# Patient Record
Sex: Female | Born: 1970 | Race: White | Hispanic: No | Marital: Married | Smoking: Former smoker
Health system: Southern US, Community
[De-identification: ages and names within clinical notes are randomized; demographics above are authoritative.]

## PROBLEM LIST (undated history)

## (undated) DIAGNOSIS — F329 Major depressive disorder, single episode, unspecified: Secondary | ICD-10-CM

## (undated) DIAGNOSIS — R32 Unspecified urinary incontinence: Secondary | ICD-10-CM

## (undated) DIAGNOSIS — F419 Anxiety disorder, unspecified: Secondary | ICD-10-CM

## (undated) DIAGNOSIS — D649 Anemia, unspecified: Secondary | ICD-10-CM

## (undated) DIAGNOSIS — E785 Hyperlipidemia, unspecified: Secondary | ICD-10-CM

## (undated) DIAGNOSIS — F32A Depression, unspecified: Secondary | ICD-10-CM

## (undated) DIAGNOSIS — K219 Gastro-esophageal reflux disease without esophagitis: Secondary | ICD-10-CM

## (undated) HISTORY — DX: Anemia, unspecified: D64.9

## (undated) HISTORY — DX: Gastro-esophageal reflux disease without esophagitis: K21.9

## (undated) HISTORY — DX: Anxiety disorder, unspecified: F41.9

## (undated) HISTORY — DX: Depression, unspecified: F32.A

## (undated) HISTORY — DX: Hyperlipidemia, unspecified: E78.5

## (undated) HISTORY — DX: Unspecified urinary incontinence: R32

---
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
## 2000-10-10 ENCOUNTER — Encounter: Payer: Self-pay | Admitting: Emergency Medicine

## 2000-10-10 ENCOUNTER — Emergency Department (HOSPITAL_COMMUNITY): Admission: EM | Admit: 2000-10-10 | Discharge: 2000-10-10 | Payer: Self-pay | Admitting: Emergency Medicine

## 2006-10-21 ENCOUNTER — Inpatient Hospital Stay (HOSPITAL_COMMUNITY): Admission: AD | Admit: 2006-10-21 | Discharge: 2006-10-21 | Payer: Self-pay | Admitting: Obstetrics and Gynecology

## 2007-06-12 ENCOUNTER — Inpatient Hospital Stay (HOSPITAL_COMMUNITY): Admission: AD | Admit: 2007-06-12 | Discharge: 2007-06-13 | Payer: Self-pay | Admitting: Obstetrics & Gynecology

## 2010-11-21 LAB — CBC
HCT: 31.8 — ABNORMAL LOW
HCT: 37.3
Hemoglobin: 11.1 — ABNORMAL LOW
Hemoglobin: 12.9
MCHC: 34.5
MCHC: 34.9
MCV: 85.7
MCV: 85.9
Platelets: 179
Platelets: 210
RBC: 3.7 — ABNORMAL LOW
RBC: 4.36
RDW: 13.8
RDW: 13.9
WBC: 14.5 — ABNORMAL HIGH
WBC: 14.8 — ABNORMAL HIGH

## 2010-11-21 LAB — RPR: RPR Ser Ql: NONREACTIVE

## 2010-12-08 LAB — POCT PREGNANCY, URINE: Preg Test, Ur: POSITIVE

## 2010-12-08 LAB — URINALYSIS, ROUTINE W REFLEX MICROSCOPIC
Hgb urine dipstick: NEGATIVE
Ketones, ur: NEGATIVE
Protein, ur: NEGATIVE
Urobilinogen, UA: 0.2

## 2013-02-26 DIAGNOSIS — D649 Anemia, unspecified: Secondary | ICD-10-CM

## 2013-02-26 HISTORY — DX: Anemia, unspecified: D64.9

## 2014-09-22 ENCOUNTER — Encounter (HOSPITAL_COMMUNITY): Payer: Self-pay | Admitting: Emergency Medicine

## 2014-09-22 DIAGNOSIS — Z87891 Personal history of nicotine dependence: Secondary | ICD-10-CM | POA: Insufficient documentation

## 2014-09-22 DIAGNOSIS — R079 Chest pain, unspecified: Secondary | ICD-10-CM | POA: Diagnosis present

## 2014-09-22 DIAGNOSIS — R0789 Other chest pain: Secondary | ICD-10-CM | POA: Diagnosis not present

## 2014-09-22 DIAGNOSIS — D509 Iron deficiency anemia, unspecified: Secondary | ICD-10-CM | POA: Insufficient documentation

## 2014-09-22 NOTE — ED Notes (Addendum)
Pt. reports left chest pain with SOB and diaphoresis onset 10 pm , denies nausea or cough . Pt. took 1 regular ASA prior to arrival.

## 2014-09-23 ENCOUNTER — Emergency Department (HOSPITAL_COMMUNITY)
Admission: EM | Admit: 2014-09-23 | Discharge: 2014-09-23 | Disposition: A | Discharge status: Home / Self Care | Payer: 59 | Attending: Emergency Medicine | Admitting: Emergency Medicine

## 2014-09-23 ENCOUNTER — Emergency Department (HOSPITAL_COMMUNITY): Payer: 59

## 2014-09-23 DIAGNOSIS — D509 Iron deficiency anemia, unspecified: Secondary | ICD-10-CM

## 2014-09-23 DIAGNOSIS — R0789 Other chest pain: Secondary | ICD-10-CM

## 2014-09-23 LAB — CBC
HEMATOCRIT: 33.3 % — AB (ref 36.0–46.0)
Hemoglobin: 10.8 g/dL — ABNORMAL LOW (ref 12.0–15.0)
MCH: 25.3 pg — AB (ref 26.0–34.0)
MCHC: 32.4 g/dL (ref 30.0–36.0)
MCV: 78 fL (ref 78.0–100.0)
Platelets: 275 10*3/uL (ref 150–400)
RBC: 4.27 MIL/uL (ref 3.87–5.11)
RDW: 15.3 % (ref 11.5–15.5)
WBC: 7 10*3/uL (ref 4.0–10.5)

## 2014-09-23 LAB — BASIC METABOLIC PANEL
ANION GAP: 7 (ref 5–15)
BUN: 8 mg/dL (ref 6–20)
CO2: 25 mmol/L (ref 22–32)
Calcium: 9.7 mg/dL (ref 8.9–10.3)
Chloride: 106 mmol/L (ref 101–111)
Creatinine, Ser: 0.82 mg/dL (ref 0.44–1.00)
GFR calc Af Amer: >60 mL/min (ref >60)
GFR calc non Af Amer: >60 mL/min (ref >60)
GLUCOSE: 119 mg/dL — AB (ref 65–99)
Potassium: 3.8 mmol/L (ref 3.5–5.1)
Sodium: 138 mmol/L (ref 135–145)

## 2014-09-23 LAB — I-STAT TROPONIN, ED
Troponin i, poc: 0 ng/mL (ref 0.00–0.08)
Troponin i, poc: 0 ng/mL (ref 0.00–0.08)

## 2014-09-23 MED ORDER — IBUPROFEN 800 MG PO TABS
800.0000 mg | ORAL_TABLET | Freq: Once | ORAL | Status: AC
  Administered 2014-09-23: 800 mg via ORAL
  Filled 2014-09-23: qty 1

## 2014-09-23 MED ORDER — IBUPROFEN 800 MG PO TABS: 800.0000 mg | ORAL_TABLET | Freq: Three times a day (TID) | ORAL | Status: DC

## 2014-09-23 NOTE — ED Notes (Signed)
The pt   Started having generalized chest pain around 2200 tonight.  She had a weird feeling in her chest earlier.  Her chest discomfort has almost totally gone.  She had some dizziness.  She was anxious 2 weeks ago.  No long car trips no leg pain.  She appears to be very anxious.lmp July 13th

## 2014-09-23 NOTE — ED Provider Notes (Signed)
CSN: [id number]     Arrival date & time 09/22/14  2353 History  This chart was scribed for Dione Booze, MD by Doreatha Martin, ED Scribe. This patient was seen in room A01C/A01C and the patient's care was started at 2:58 AM.     Chief Complaint  Patient presents with  . Chest Pain   The history is provided by the patient. No language interpreter was used.    HPI Comments: Jean Vazquez is a 44 y.o. female who presents to the Emergency Department complaining of moderate, tight, intermittent generalized 6/10 CP onset 2200 last night while at rest. She states associated dizziness described as feeling like she would pass out, diaphoresis and shallow breathing. Pt states her initial episode lasted 30 minutes and has had 5 episodes since lasting only a few minutes (last episode at 2400). Pt states that she stretched and rested with no relief. Pain relieved by ambulation and worsened with inspiration. She notes CP a few weeks ago but not this severe. Pt is a non-drinker and a former smoker (quit 4 years ago). No FHx of CHF or CAD<55yo. Otherwise healthy. She denies nausea.  History reviewed. No pertinent past medical history. History reviewed. No pertinent past surgical history. No family history on file. History  Substance Use Topics  . Smoking status: Former Games developer  . Smokeless tobacco: Not on file  . Alcohol Use: No   OB History    No data available     Review of Systems  Constitutional: Positive for diaphoresis.  Respiratory:       Shallow breathing  Cardiovascular: Positive for chest pain.  Gastrointestinal: Negative for nausea.  Neurological: Positive for dizziness.  All other systems reviewed and are negative.   Allergies  Review of patient's allergies indicates no known allergies.  Home Medications   Prior to Admission medications   Not on File   BP 126/72 mmHg  Pulse 59  Temp(Src) 97.7 F (36.5 C) (Oral)  Resp 14  Wt 158 lb 3.2 oz (71.759 kg)  SpO2 99%  LMP  09/08/2014 Physical Exam  Constitutional: She is oriented to person, place, and time. She appears well-developed and well-nourished.  HENT:  Head: Normocephalic and atraumatic.  Eyes: Conjunctivae and EOM are normal. Pupils are equal, round, and reactive to light.  Neck: Normal range of motion. Neck supple.  Cardiovascular: Normal rate.   Pulmonary/Chest: Effort normal. No respiratory distress.  Abdominal: She exhibits no distension.  Musculoskeletal: Normal range of motion. She exhibits tenderness.  Moderate tenderness left anterior ribcage  Neurological: She is alert and oriented to person, place, and time.  Skin: Skin is warm and dry.  Psychiatric: She has a normal mood and affect. Her behavior is normal.  Nursing note and vitals reviewed.   ED Course  Procedures (including critical care time) DIAGNOSTIC STUDIES: Oxygen Saturation is 99% on RA, normal by my interpretation.    COORDINATION OF CARE: 3:07 AM Discussed treatment plan with pt at bedside and pt agreed to plan.   Labs Review Results for orders placed or performed during the hospital encounter of 09/23/14  Basic metabolic panel  Result Value Ref Range   Sodium 138 135 - 145 mmol/L   Potassium 3.8 3.5 - 5.1 mmol/L   Chloride 106 101 - 111 mmol/L   CO2 25 22 - 32 mmol/L   Glucose, Bld 119 (H) 65 - 99 mg/dL   BUN 8 6 - 20 mg/dL   Creatinine, Ser 4.09 0.44 - 1.00 mg/dL  Calcium 9.7 8.9 - 10.3 mg/dL   GFR calc non Af Amer >60 >60 mL/min   GFR calc Af Amer >60 >60 mL/min   Anion gap 7 5 - 15  CBC  Result Value Ref Range   WBC 7.0 4.0 - 10.5 K/uL   RBC 4.27 3.87 - 5.11 MIL/uL   Hemoglobin 10.8 (L) 12.0 - 15.0 g/dL   HCT 40.9 (L) 81.1 - 91.4 %   MCV 78.0 78.0 - 100.0 fL   MCH 25.3 (L) 26.0 - 34.0 pg   MCHC 32.4 30.0 - 36.0 g/dL   RDW 78.2 95.6 - 21.3 %   Platelets 275 150 - 400 K/uL  I-stat troponin, ED  Result Value Ref Range   Troponin i, poc 0.00 0.00 - 0.08 ng/mL   Comment 3          I-stat troponin,  ED  Result Value Ref Range   Troponin i, poc 0.00 0.00 - 0.08 ng/mL   Comment 3           Imaging Review Dg Chest 2 View  09/23/2014   CLINICAL DATA:  Left-sided chest pain, onset tonight while sleeping.  EXAM: CHEST  2 VIEW  COMPARISON:  None.  FINDINGS: The cardiomediastinal contours are normal. The lungs are clear. Pulmonary vasculature is normal. No consolidation, pleural effusion, or pneumothorax. No acute osseous abnormalities are seen.  IMPRESSION: No acute pulmonary process.   Electronically Signed   By: Rubye Oaks M.D.   On: 09/23/2014 01:19     EKG Interpretation   Date/Time:  Thursday September 23 2014 00:01:05 EDT Ventricular Rate:  70 PR Interval:  130 QRS Duration: 78 QT Interval:  368 QTC Calculation: 397 R Axis:   46 Text Interpretation:  Normal sinus rhythm with sinus arrhythmia Normal ECG  No old tracing to compare Confirmed by [facility]  MD, Sherrita Riederer ([id number]) on  09/23/2014 2:45:05 AM      MDM   Final diagnoses:  Chest wall pain  Microcytic anemia    Chest pain which seems most consistent with chest wall pain. ECG is unremarkable and troponin is normal. She has been pain-free since arrival in the ED. She's given a dose of ibuprofen with good relief of pain. Repeat troponin is also normal. She is discharged with prescription for ibuprofen.  I personally performed the services described in this documentation, which was scribed in my presence. The recorded information has been reviewed and is accurate.     Dione Booze, MD 09/23/14 [phone]

## 2014-09-23 NOTE — ED Notes (Signed)
Med given 

## 2014-09-23 NOTE — Discharge Instructions (Signed)
Your blood sugar was a little high today - 119. This is sometimes called "pre-diabetes". Please check your blood sugar every 1-2 months to make sure you are not becoming diabetic.  Chest Wall Pain Chest wall pain is pain in or around the bones and muscles of your chest. It may take up to 6 weeks to get better. It may take longer if you must stay physically active in your work and activities.  CAUSES  Chest wall pain may happen on its own. However, it may be caused by:  A viral illness like the flu.  Injury.  Coughing.  Exercise.  Arthritis.  Fibromyalgia.  Shingles. HOME CARE INSTRUCTIONS   Avoid overtiring physical activity. Try not to strain or perform activities that cause pain. This includes any activities using your chest or your abdominal and side muscles, especially if heavy weights are used.  Put ice on the sore area.  Put ice in a plastic bag.  Place a towel between your skin and the bag.  Leave the ice on for 15-20 minutes per hour while awake for the first 2 days.  Only take over-the-counter or prescription medicines for pain, discomfort, or fever as directed by your caregiver. SEEK IMMEDIATE MEDICAL CARE IF:   Your pain increases, or you are very uncomfortable.  You have a fever.  Your chest pain becomes worse.  You have new, unexplained symptoms.  You have nausea or vomiting.  You feel sweaty or lightheaded.  You have a cough with phlegm (sputum), or you cough up blood. MAKE SURE YOU:   Understand these instructions.  Will watch your condition.  Will get help right away if you are not doing well or get worse. Document Released: 02/12/2005 Document Revised: 05/07/2011 Document Reviewed: 10/09/2010 [facility] Patient Information 2015 [place], [place]. This information is not intended to replace advice given to you by your health care provider. Make sure you discuss any questions you have with your health care provider.  Ibuprofen tablets and  capsules What is this medicine? IBUPROFEN (eye BYOO proe fen) is a non-steroidal anti-inflammatory drug (NSAID). It is used for dental pain, fever, headaches or migraines, osteoarthritis, rheumatoid arthritis, or painful monthly periods. It can also relieve minor aches and pains caused by a cold, flu, or sore throat. This medicine may be used for other purposes; ask your health care provider or pharmacist if you have questions. COMMON BRAND NAME(S): Advil, Advil Junior Strength, Advil Migraine, Genpril, Ibren, IBU, Midol, Midol Cramps and Body Aches, Motrin, Motrin IB, Motrin Junior Strength, Motrin Migraine Pain, Samson-8, Toxicology Saliva Collection What should I tell my health care provider before I take this medicine? They need to know if you have any of these conditions: -asthma -cigarette smoker -drink more than 3 alcohol containing drinks a day -heart disease or circulation problems such as heart failure or leg edema (fluid retention) -high blood pressure -kidney disease -liver disease -stomach bleeding or ulcers -an unusual or allergic reaction to ibuprofen, aspirin, other NSAIDS, other medicines, foods, dyes, or preservatives -pregnant or trying to get pregnant -breast-feeding How should I use this medicine? Take this medicine by mouth with a glass of water. Follow the directions on the prescription label. Take this medicine with food if your stomach gets upset. Try to not lie down for at least 10 minutes after you take the medicine. Take your medicine at regular intervals. Do not take your medicine more often than directed. A special MedGuide will be given to you by the pharmacist with each  prescription and refill. Be sure to read this information carefully each time. Talk to your pediatrician regarding the use of this medicine in children. Special care may be needed. Overdosage: If you think you have taken too much of this medicine contact a poison control center or emergency room  at once. NOTE: This medicine is only for you. Do not share this medicine with others. What if I miss a dose? If you miss a dose, take it as soon as you can. If it is almost time for your next dose, take only that dose. Do not take double or extra doses. What may interact with this medicine? Do not take this medicine with any of the following medications: -cidofovir -ketorolac -methotrexate -pemetrexed This medicine may also interact with the following medications: -alcohol -aspirin -diuretics -lithium -other drugs for inflammation like prednisone -warfarin This list may not describe all possible interactions. Give your health care provider a list of all the medicines, herbs, non-prescription drugs, or dietary supplements you use. Also tell them if you smoke, drink alcohol, or use illegal drugs. Some items may interact with your medicine. What should I watch for while using this medicine? Tell your doctor or healthcare professional if your symptoms do not start to get better or if they get worse. This medicine does not prevent heart attack or stroke. In fact, this medicine may increase the chance of a heart attack or stroke. The chance may increase with longer use of this medicine and in people who have heart disease. If you take aspirin to prevent heart attack or stroke, talk with your doctor or health care professional. Do not take other medicines that contain aspirin, ibuprofen, or naproxen with this medicine. Side effects such as stomach upset, nausea, or ulcers may be more likely to occur. Many medicines available without a prescription should not be taken with this medicine. This medicine can cause ulcers and bleeding in the stomach and intestines at any time during treatment. Ulcers and bleeding can happen without warning symptoms and can cause death. To reduce your risk, do not smoke cigarettes or drink alcohol while you are taking this medicine. You may get drowsy or dizzy. Do not  drive, use machinery, or do anything that needs mental alertness until you know how this medicine affects you. Do not stand or sit up quickly, especially if you are an older patient. This reduces the risk of dizzy or fainting spells. This medicine can cause you to bleed more easily. Try to avoid damage to your teeth and gums when you brush or floss your teeth. This medicine may be used to treat migraines. If you take migraine medicines for 10 or more days a month, your migraines may get worse. Keep a diary of headache days and medicine use. Contact your healthcare professional if your migraine attacks occur more frequently. What side effects may I notice from receiving this medicine? Side effects that you should report to your doctor or health care professional as soon as possible: -allergic reactions like skin rash, itching or hives, swelling of the face, lips, or tongue -severe stomach pain -signs and symptoms of bleeding such as bloody or black, tarry stools; red or dark-brown urine; spitting up blood or brown material that looks like coffee grounds; red spots on the skin; unusual bruising or bleeding from the eye, gums, or nose -signs and symptoms of a blood clot such as changes in vision; chest pain; severe, sudden headache; trouble speaking; sudden numbness or weakness of the face, arm,  or leg -unexplained weight gain or swelling -unusually weak or tired -yellowing of eyes or skin Side effects that usually do not require medical attention (report to your doctor or health care professional if they continue or are bothersome): -bruising -diarrhea -dizziness, drowsiness -headache -nausea, vomiting This list may not describe all possible side effects. Call your doctor for medical advice about side effects. You may report side effects to FDA at 1-800-FDA-1088. Where should I keep my medicine? Keep out of the reach of children. Store at room temperature between 15 and 30 degrees C (59 and 86  degrees F). Keep container tightly closed. Throw away any unused medicine after the expiration date. NOTE: This sheet is a summary. It may not cover all possible information. If you have questions about this medicine, talk to your doctor, pharmacist, or health care provider.  2015, Elsevier/Gold Standard. (2012-10-14 10:48:02)   Emergency Department Resource Guide 1) Find a Doctor and Pay Out of Pocket Although you won't have to find out who is covered by your insurance plan, it is a good idea to ask around and get recommendations. You will then need to call the office and see if the doctor you have chosen will accept you as a new patient and what types of options they offer for patients who are self-pay. Some doctors offer discounts or will set up payment plans for their patients who do not have insurance, but you will need to ask so you aren't surprised when you get to your appointment.  2) Contact Your Local Health Department Not all health departments have doctors that can see patients for sick visits, but many do, so it is worth a call to see if yours does. If you don't know where your local health department is, you can check in your phone book. The CDC also has a tool to help you locate your state's health department, and many state websites also have listings of all of their local health departments.  3) Find a Walk-in Clinic If your illness is not likely to be very severe or complicated, you may want to try a walk in clinic. These are popping up all over the country in pharmacies, drugstores, and shopping centers. They're usually staffed by nurse practitioners or physician assistants that have been trained to treat common illnesses and complaints. They're usually fairly quick and inexpensive. However, if you have serious medical issues or chronic medical problems, these are probably not your best option.  No Primary Care Doctor: - Call Health Connect at  [phone] - they can help you locate  a primary care doctor that  accepts your insurance, provides certain services, etc. - Physician Referral Service- [phone]  Chronic Pain Problems: Organization         Address  Phone   Notes  [facility]  [phone] Patients need to be referred by their primary care doctor.   Medication Assistance: Organization         Address  Phone   Notes  [facility] Medication [facility] [address] [place] [place], [place] [address] [phone] --Must be a resident of [facility] -- Must have NO insurance coverage whatsoever (no Medicaid/ Medicare, etc.) -- The pt. MUST have a primary care doctor that directs their care regularly and follows them in the community   [facility]  [phone]   [employer]  [phone]    Agencies that provide inexpensive medical care: Organization  Address  Phone   Notes  [place]  [phone]   [facility]    [phone]   [facility] [place], [place] [address] [phone]   [place] [address] [place] [place] [phone]   Planned Parenthood    [phone]   [facility]    [phone]   [place] and [address], [place] Phone:  [phone], Fax:  [phone] Hours of Operation:  9 am - 6 pm, M-F.  Also accepts Medicaid/Medicare and self-pay.  [facility] for [place], [address], [place] Phone: [phone], Fax: [phone]. Hours of Operation:  8:30 am - 5:30 pm, M-F.  Also accepts Medicaid and self-pay.  [facility] [place] [address] [place] Phone: [phone]   [place], [place], [place] [phone], Ext. [phone] Mondays & Thursdays: 7-9 AM.  First 15 patients are seen on a first come, first serve basis.    [place]  Providers:  Organization         Address  Phone   Notes  [facility] [address], [place] [phone] Also accepts self-pay patients.  [facility] [address], [place]  [phone]   [place], [address], [place] [phone]   [facility] [address], [place] [phone]   Lucianne Lei [address], [place]   [phone] Only accepts [place] Access [place] patients after they have their name applied to their card.   Self-Pay (no insurance) in [facility]:  Organization         Address  Phone   Notes  Sickle Cell Patients, [facility] [place] [phone]   [facility] [place] [phone]   [facility] [place]  [place], [place], [place] [phone]   [facility]/Dr. Osei-Bonsu  [address] [place] or [address], [place] [phone] Phone number for both [place] and [place] locations is the same.  [facility] [address], [place] [phone]   [facility] [address], [place] or [address] [phone]   [facility] [address] [place] [phone], phone; [phone], fax Sees patients 1st and 3rd Saturday of every month.  Must not qualify for public or private insurance (i.e. Medicaid, Medicare, [place] Health Choice, Veterans' Benefits)  Household income should be no more than 200% of the poverty level The clinic cannot treat you if you are pregnant or think you are pregnant  Sexually transmitted diseases are not treated at the clinic.    Dental Care: Organization         Address  Phone  Notes  [facility] Department of [facility] [place] [phone] Accepts children up to age 77 who are enrolled in [place] or [place]; pregnant women with a Medicaid card; and children who have applied for Medicaid or [place] Health Choice, but were declined, whose parents can pay a reduced fee at time  of service.  [facility] Department of [facility]  [address], [place] [phone] Accepts children up to age 8 who are enrolled in [place] or [place]; pregnant women with a Medicaid card; and children who have applied for Medicaid or  Health Choice, but were declined, whose parents can pay a reduced fee at time of service.  [place] Adult Dental Access PROGRAM  [place] [phone] Patients are seen by appointment only. Walk-ins are not accepted. [place] will see patients 36 years of age and older. Monday - Tuesday (8am-5pm) Most Wednesdays (8:30-5pm) $30 per visit, cash only  [facility] Adult Dental Access PROGRAM  [address], [facility] [phone] Patients are seen by appointment only. Walk-ins are not accepted. [place] will see patients 63 years of age and older. One Wednesday Evening (Monthly: Volunteer Based).  $30 per visit, cash only  [place]  [phone] for adults; Children under age 61, call Graduate Pediatric Dentistry at [phone]. Children aged 4-14, please call [phone] to request a pediatric application.  Dental services are provided in all areas of dental care including fillings, crowns and bridges, complete and partial dentures, implants, gum treatment, root canals, and extractions. Preventive care is also provided. Treatment is provided to both adults and children. Patients are selected via a lottery and there is often a waiting list.   [facility] [address] [place]  [phone] [email or web address]   [facility]  [address] [place], [place] [phone], Ext. [phone] Second and Fourth Thursday of each month, opens at 6:30 AM; Clinic ends at 9 AM.  Patients are seen on a first-come first-served basis, and a limited number are seen during each clinic.   [facility]  [address] Hillard Danker [place], [place] [phone]   Eligibility Requirements You must have lived in [place], [place], or [place] counties for at least the last three months.   You cannot be eligible for state or federal sponsored [employer], including [employer], [place], or [employer].   You generally cannot be eligible for healthcare insurance through your employer.    How to apply: Eligibility screenings are held every Tuesday and Wednesday afternoon from 1:00 pm until 4:00 pm. You do not need an appointment for the interview!  [facility] [address] [place], [place]   [place]  [place] Department  [place]  [phone]    Behavioral Health Resources in the Community: Intensive Outpatient Programs Organization         Address  Phone  Notes  [place]. [address], [place], [place] [phone]   [facility] [address] [place], [place]   ADS: [facility] [address] [place], [place]   [place] [address]  [place], [place] or [phone]   Substance Abuse Resources Organization         Address  Phone  Notes  Alcohol and Drug Services  [phone]   [place]  [phone]   [facility] [place]  [phone]   Chinita Pester  [phone]   Residential & Outpatient Substance Abuse Program  [phone]   Psychological Services Organization         Address  Phone  Notes  [facility]  [phone]    [facility]  [phone]   [facility] [address], [place] [phone] or [phone]    Mobile Crisis Teams Organization         Address  Phone  Notes  [facility], Mobile Crisis Care Unit  [phone]   [facility]  [address] [place], [place] [phone]   Doristine Locks [address] [place] [phone]    Self-Help/Support Groups Organization         Address  Phone             Notes  [facility]oc[facility] of [place] - variety of support groups  336- I7437963 Call for more information  Narcotics Anonymous (NA), [facility] [address], [employer] [place]  2 meetings at this location   Statistician         Address  Phone  Notes  [facility] [address],    [place]  [phone]   [facility]  [address] [place] [address], [place], [place] [phone]   [facility] [address] [place], [place] [phone] Admissions: 8am-3pm M-F  [facility] [address],    [place], [place] [phone]   [facility] [address] [place], [address] [place], [place] [phone]   [facility] [address]  [place], [place] [phone]   [facility] - Intensive Outpatient [address], [place], [place] [phone]   [facility] [address] [place].,  [place], [place] [phone] or [phone]   [facility] ([facility]) [address], [place], [place] [phone] Accepts Medicaid  [facility] [address],  [place] [phone] Substance Abuse/Addiction Treatment   [facility] Organization         Address  Phone  Notes  [facility]  9202520276   Angie Fava, PhD [address] [place], [place]   [phone] or [phone]    [facility] Behavioral   [address] [place], [place] [phone]   [facility] [address], [place], [place] [phone] Insurance/Medicaid/sponsorship through [facility] and Families [address]                                    [place], [place] [phone] Therapy/tele-psych/case  [facility] [address], [place] [phone]    Dr. Lolly Mustache  [phone]   [facility] [place]  [employer] [facility] Dept. 1) [address] [place] 2) [address] [place] 3)  [address], [place] [phone]  [phone]   [facility] Child Abuse Hotline 947-870-2711 or [phone] (After Hours)

## 2014-11-05 ENCOUNTER — Other Ambulatory Visit: Payer: Self-pay | Admitting: Family

## 2014-11-05 DIAGNOSIS — Z1231 Encounter for screening mammogram for malignant neoplasm of breast: Secondary | ICD-10-CM

## 2014-11-19 ENCOUNTER — Ambulatory Visit: Payer: 59

## 2014-12-07 DIAGNOSIS — F419 Anxiety disorder, unspecified: Secondary | ICD-10-CM | POA: Insufficient documentation

## 2014-12-07 DIAGNOSIS — F32A Depression, unspecified: Secondary | ICD-10-CM | POA: Insufficient documentation

## 2015-01-13 DIAGNOSIS — F4322 Adjustment disorder with anxiety: Secondary | ICD-10-CM | POA: Insufficient documentation

## 2015-02-01 DIAGNOSIS — K219 Gastro-esophageal reflux disease without esophagitis: Secondary | ICD-10-CM | POA: Insufficient documentation

## 2016-08-28 DIAGNOSIS — D500 Iron deficiency anemia secondary to blood loss (chronic): Secondary | ICD-10-CM | POA: Insufficient documentation

## 2017-08-01 LAB — BASIC METABOLIC PANEL
BUN: 14 (ref 4–21)
CO2: 23 — AB (ref 13–22)
Chloride: 102 (ref 99–108)
Creatinine: 0.8 (ref 0.5–1.1)
Glucose: 73
Potassium: 4.5 (ref 3.4–5.3)
Sodium: 137 (ref 137–147)

## 2017-08-01 LAB — COMPREHENSIVE METABOLIC PANEL
Albumin: 4.6 (ref 3.5–5.0)
Calcium: 9.9 (ref 8.7–10.7)
GFR calc Af Amer: 97
GFR calc non Af Amer: 83
Globulin: 2.6

## 2017-08-01 LAB — CBC AND DIFFERENTIAL
HCT: 36 (ref 36–46)
HCT: 36 (ref 36–46)
Hemoglobin: 11.2 — AB (ref 12.0–16.0)
Hemoglobin: 11.2 — AB (ref 12.0–16.0)
Neutrophils Absolute: 4451
Neutrophils Absolute: 4451
Platelets: 370 (ref 150–399)
Platelets: 370 (ref 150–399)
WBC: 7.7
WBC: 7.7

## 2017-08-01 LAB — LIPID PANEL
Cholesterol: 302 — AB (ref 0–200)
HDL: 60 (ref 35–70)
LDL Cholesterol: 193
LDl/HDL Ratio: 5
Triglycerides: 274 — AB (ref 40–160)

## 2017-08-01 LAB — HEPATIC FUNCTION PANEL
ALT: 21 (ref 7–35)
AST: 32 (ref 13–35)
Alkaline Phosphatase: 57 (ref 25–125)
Bilirubin, Total: 0.4

## 2017-08-01 LAB — TSH: TSH: 0.79 (ref 0.41–5.90)

## 2017-08-01 LAB — CBC
RBC: 4.79 (ref 3.87–5.11)
RBC: 4.79 (ref 3.87–5.11)

## 2017-08-05 LAB — HM MAMMOGRAPHY

## 2017-08-13 LAB — CBC AND DIFFERENTIAL
HCT: 37 (ref 36–46)
Hemoglobin: 11.4 — AB (ref 12.0–16.0)
Neutrophils Absolute: 3808
Platelets: 294 (ref 150–399)
WBC: 6.6

## 2017-08-13 LAB — TSH: TSH: 0.56 (ref 0.41–5.90)

## 2017-08-13 LAB — BASIC METABOLIC PANEL WITH GFR
BUN: 13 (ref 4–21)
CO2: 23 — AB (ref 13–22)
Chloride: 106 (ref 99–108)
Creatinine: 0.9 (ref 0.5–1.1)
Glucose: 90
Potassium: 4.7 (ref 3.4–5.3)
Sodium: 138 (ref 137–147)

## 2017-08-13 LAB — HEPATIC FUNCTION PANEL
ALT: 14 (ref 7–35)
AST: 17 (ref 13–35)
Alkaline Phosphatase: 61 (ref 25–125)
Bilirubin, Total: 0.2

## 2017-08-13 LAB — COMPREHENSIVE METABOLIC PANEL
Albumin: 3.9 (ref 3.5–5.0)
Calcium: 9.4 (ref 8.7–10.7)
GFR calc Af Amer: 95
GFR calc non Af Amer: 82
Globulin: 2.6

## 2017-08-13 LAB — LIPID PANEL
Cholesterol: 144 (ref 0–200)
HDL: 59 (ref 35–70)
LDL Cholesterol: 66
LDl/HDL Ratio: 2.4
Triglycerides: 105 (ref 40–160)

## 2017-08-13 LAB — CBC: RBC: 4.54 (ref 3.87–5.11)

## 2017-11-05 LAB — HEPATIC FUNCTION PANEL
ALT: 14 (ref 7–35)
AST: 17 (ref 13–35)
Alkaline Phosphatase: 61 (ref 25–125)
Bilirubin, Total: 0.2

## 2017-11-05 LAB — LIPID PANEL
Cholesterol: 144 (ref 0–200)
HDL: 59 (ref 35–70)
LDL Cholesterol: 66
LDl/HDL Ratio: 2.4
Triglycerides: 105 (ref 40–160)

## 2017-11-05 LAB — TSH: TSH: 0.56 (ref 0.41–5.90)

## 2017-11-05 LAB — COMPREHENSIVE METABOLIC PANEL
Albumin: 3.9 (ref 3.5–5.0)
Calcium: 9.4 (ref 8.7–10.7)
GFR calc Af Amer: 95
GFR calc non Af Amer: 82
Globulin: 2.6

## 2017-11-05 LAB — BASIC METABOLIC PANEL
BUN: 13 (ref 4–21)
CO2: 23 — AB (ref 13–22)
Chloride: 106 (ref 99–108)
Creatinine: 0.9 (ref 0.5–1.1)
Glucose: 90
Potassium: 4.7 (ref 3.4–5.3)
Sodium: 138 (ref 137–147)

## 2017-11-05 LAB — CBC AND DIFFERENTIAL
HCT: 37 (ref 36–46)
Hemoglobin: 11.4 — AB (ref 12.0–16.0)
Platelets: 294 (ref 150–399)
WBC: 6.6

## 2017-11-05 LAB — CBC: RBC: 4.54 (ref 3.87–5.11)

## 2018-06-25 LAB — BASIC METABOLIC PANEL
BUN: 13 (ref 4–21)
CO2: 21 (ref 13–22)
Chloride: 105 (ref 99–108)
Creatinine: 0.9 (ref 0.5–1.1)
Glucose: 111
Potassium: 4.8 (ref 3.4–5.3)
Sodium: 141 (ref 137–147)

## 2018-06-25 LAB — LIPID PANEL
Cholesterol: 169 (ref 0–200)
HDL: 48 (ref 35–70)
LDL Cholesterol: 84
Triglycerides: 185 — AB (ref 40–160)

## 2018-06-25 LAB — COMPREHENSIVE METABOLIC PANEL
Albumin: 4.3 (ref 3.5–5.0)
Calcium: 9.5 (ref 8.7–10.7)
GFR calc Af Amer: 85
GFR calc non Af Amer: 73
Globulin: 2.3

## 2018-06-25 LAB — HEPATIC FUNCTION PANEL
ALT: 17 (ref 7–35)
AST: 15 (ref 13–35)
Alkaline Phosphatase: 65 (ref 25–125)
Bilirubin, Total: 0.3

## 2018-06-25 LAB — CBC AND DIFFERENTIAL
HCT: 31 — AB (ref 36–46)
HCT: 31 — AB (ref 36–46)
Hemoglobin: 9.4 — AB (ref 12.0–16.0)
Hemoglobin: 9.4 — AB (ref 12.0–16.0)
Neutrophils Absolute: 4
Neutrophils Absolute: 4
Platelets: 348 (ref 150–399)
Platelets: 349 (ref 150–399)
WBC: 5.8
WBC: 5.8

## 2018-06-25 LAB — CBC
RBC: 4.53 (ref 3.87–5.11)
RBC: 4.53 (ref 3.87–5.11)

## 2018-06-25 LAB — TSH: TSH: 0.93 (ref 0.41–5.90)

## 2018-08-14 ENCOUNTER — Other Ambulatory Visit: Payer: Self-pay | Admitting: Family Medicine

## 2018-08-14 DIAGNOSIS — N920 Excessive and frequent menstruation with regular cycle: Secondary | ICD-10-CM

## 2018-09-09 ENCOUNTER — Other Ambulatory Visit: Payer: BLUE CROSS/BLUE SHIELD

## 2018-09-15 LAB — HM MAMMOGRAPHY

## 2018-09-16 ENCOUNTER — Ambulatory Visit
Admission: RE | Admit: 2018-09-16 | Discharge: 2018-09-16 | Disposition: A | Discharge status: Home / Self Care | Payer: BLUE CROSS/BLUE SHIELD | Source: Ambulatory Visit | Attending: Family Medicine | Admitting: Family Medicine

## 2018-09-16 DIAGNOSIS — N920 Excessive and frequent menstruation with regular cycle: Secondary | ICD-10-CM

## 2019-05-19 ENCOUNTER — Other Ambulatory Visit: Payer: Self-pay

## 2019-05-20 ENCOUNTER — Encounter: Payer: Self-pay | Admitting: Family Medicine

## 2019-05-20 ENCOUNTER — Ambulatory Visit (INDEPENDENT_AMBULATORY_CARE_PROVIDER_SITE_OTHER): Payer: 59 | Admitting: Family Medicine

## 2019-05-20 VITALS — BP 136/78 | HR 73 | Temp 98.3°F | Ht 64.0 in

## 2019-05-20 DIAGNOSIS — M7661 Achilles tendinitis, right leg: Secondary | ICD-10-CM | POA: Insufficient documentation

## 2019-05-20 DIAGNOSIS — Z0000 Encounter for general adult medical examination without abnormal findings: Secondary | ICD-10-CM | POA: Diagnosis not present

## 2019-05-20 DIAGNOSIS — K219 Gastro-esophageal reflux disease without esophagitis: Secondary | ICD-10-CM | POA: Insufficient documentation

## 2019-05-20 DIAGNOSIS — E611 Iron deficiency: Secondary | ICD-10-CM | POA: Insufficient documentation

## 2019-05-20 DIAGNOSIS — F418 Other specified anxiety disorders: Secondary | ICD-10-CM

## 2019-05-20 DIAGNOSIS — E7800 Pure hypercholesterolemia, unspecified: Secondary | ICD-10-CM | POA: Diagnosis not present

## 2019-05-20 DIAGNOSIS — Z1211 Encounter for screening for malignant neoplasm of colon: Secondary | ICD-10-CM | POA: Insufficient documentation

## 2019-05-20 MED ORDER — BUSPIRONE HCL 15 MG PO TABS: 15.0000 mg | ORAL_TABLET | Freq: Two times a day (BID) | ORAL | 5 refills | Status: DC

## 2019-05-20 MED ORDER — ATORVASTATIN CALCIUM 40 MG PO TABS: 40.0000 mg | ORAL_TABLET | Freq: Every day | ORAL | 1 refills | Status: DC

## 2019-05-20 MED ORDER — DICLOFENAC SODIUM 50 MG PO TBEC: 50.0000 mg | DELAYED_RELEASE_TABLET | Freq: Two times a day (BID) | ORAL | 0 refills | Status: AC

## 2019-05-20 MED ORDER — SERTRALINE HCL 100 MG PO TABS: 200.0000 mg | ORAL_TABLET | Freq: Every day | ORAL | 5 refills | Status: DC

## 2019-05-20 MED ORDER — POLYSACCHARIDE IRON COMPLEX 150 MG PO CAPS: 150.0000 mg | ORAL_CAPSULE | Freq: Every day | ORAL | 5 refills | Status: DC

## 2019-05-20 MED ORDER — PANTOPRAZOLE SODIUM 40 MG PO TBEC: 40.0000 mg | DELAYED_RELEASE_TABLET | Freq: Every day | ORAL | 5 refills | Status: DC

## 2019-05-20 NOTE — Patient Instructions (Signed)
Rosen's Emergency Medicine: Concepts and Clinical Practice (9th ed., pp. 1392-1401). [place], [place]: Elsevier, Inc. Retrieved from https://www.clinicalkey.com/#!/content/[email or web address]  Achilles Tendinitis  Achilles tendinitis is inflammation of the tough, cord-like band that attaches the lower leg muscles to the heel bone (Achilles tendon). This is usually caused by overusing the tendon and the ankle joint. Achilles tendinitis usually gets better over time with treatment and caring for yourself at home. It can take weeks or months to heal completely. What are the causes? This condition may be caused by:  A sudden increase in exercise or activity, such as running.  Doing the same exercises or activities, such as jumping, over and over.  Not warming up calf muscles before exercising.  Exercising in shoes that are worn out or not made for exercise.  Having arthritis or a bone growth (spur) on the back of the heel bone. This can rub against the tendon and hurt it.  Age-related wear and tear. Tendons become less flexible with age and are more likely to be injured. What are the signs or symptoms? Common symptoms of this condition include:  Pain in the Achilles tendon or in the back of the leg, just above the heel. The pain usually gets worse with exercise.  Stiffness or soreness in the back of the leg, especially in the morning.  Swelling of the skin over the Achilles tendon.  Thickening of the tendon.  Trouble standing on tiptoe. How is this diagnosed? This condition is diagnosed based on your symptoms and a physical exam. You may have tests, including:  X-rays.  MRI. How is this treated? The goal of treatment is to relieve symptoms and help your injury heal. Treatment may include:  Decreasing or stopping activities that caused the tendinitis. This may mean switching to low-impact exercises like biking or swimming.  Icing the injured  area.  Doing physical therapy, including strengthening and stretching exercises.  Taking NSAIDs, such as ibuprofen, to help relieve pain and swelling.  Using supportive shoes, wraps, heel lifts, or a walking boot (air cast).  Having surgery. This may be done if your symptoms do not improve after other treatments.  Using high-energy shock wave impulses to stimulate the healing process (extracorporeal shock wave therapy). This is rare.  Having an injection of medicines that help relieve inflammation (corticosteroids). This is rare. Follow these instructions at home: If you have an air cast:  Wear the air cast as told by your health care provider. Remove it only as told by your health care provider.  Loosen it if your toes tingle, become numb, or turn cold and blue.  Keep it clean.  If the air cast is not waterproof: ? Do not let it get wet. ? Cover it with a watertight covering when you take a bath or shower. Managing pain, stiffness, and swelling   If directed, put ice on the injured area. To do this: ? If you have a removable air cast, remove it as told by your health care provider. ? Put ice in a plastic bag. ? Place a towel between your skin and the bag. ? Leave the ice on for 20 minutes, 2-3 times a day.  Move your toes often to reduce stiffness and swelling.  Raise (elevate) your foot above the level of your heart while you are sitting or lying down. Activity  Gradually return to your normal activities as told by your health care provider. Ask your health care provider what activities are safe for you.  Do not do   activities that cause pain.  Consider doing low-impact exercises, like cycling or swimming.  Ask your health care provider when it is safe to drive if you have an air cast on your foot.  If physical therapy was prescribed, do exercises as told by your health care provider or physical therapist. General instructions  If directed, wrap your foot with an  elastic bandage or other wrap. This can help to keep your tendon from moving too much while it heals. Your health care provider will show you how to wrap your foot correctly.  Wear supportive shoes or heel lifts only as told by your health care provider.  Take over-the-counter and prescription medicines only as told by your health care provider.  Keep all follow-up visits as told by your health care provider. This is important. Contact a health care provider if you:  Have symptoms that get worse.  Have pain that does not get better with medicine.  Develop new, unexplained symptoms.  Develop warmth and swelling in your foot.  Have a fever. Get help right away if you:  Have a sudden popping sound or sensation in your Achilles tendon followed by severe pain.  Cannot move your toes or foot.  Cannot put any weight on your foot.  Your foot or toes become numb and look white or blue even after loosening your bandage or air cast. Summary  Achilles tendinitis is inflammation of the tough, cord-like band that attaches the lower leg muscles to the heel bone (Achilles tendon).  This condition is usually caused by overusing the tendon and the ankle joint. It can also be caused by arthritis or normal aging.  The most common symptoms of this condition include pain, swelling, or stiffness in the Achilles tendon or in the back of the leg.  This condition is usually treated by decreasing or stopping activities that caused the tendinitis, icing the injured area, taking NSAIDs, and doing physical therapy. This information is not intended to replace advice given to you by your health care provider. Make sure you discuss any questions you have with your health care provider. Document Revised: 06/30/2018 Document Reviewed: 06/30/2018 Elsevier Patient Education  2020 [employer] Inc.  Achilles Tendinitis Rehab Ask your health care provider which exercises are safe for you. Do exercises exactly as told  by your health care provider and adjust them as directed. It is normal to feel mild stretching, pulling, tightness, or discomfort as you do these exercises. Stop right away if you feel sudden pain or your pain gets worse. Do not begin these exercises until told by your health care provider. Stretching and range-of-motion exercises These exercises warm up your muscles and joints and improve the movement and flexibility of your ankle. These exercises also help to relieve pain. Standing wall calf stretch with straight knee  1. Stand with your hands against a wall. 2. Extend your left / right leg behind you, and bend your front knee slightly. Keep both of your heels on the floor. 3. Point the toes of your back foot slightly inward. 4. Keeping your heels on the floor and your back knee straight, shift your weight toward the wall. Do not allow your back to arch. You should feel a gentle stretch in your upper calf. 5. Hold this position for __________ seconds. Repeat __________ times. Complete this exercise __________ times a day. Standing wall calf stretch with bent knee 1. Stand with your hands against a wall. 2. Extend your left / right leg behind you, and  bend your front knee slightly. Keep both of your heels on the floor. 3. Point the toes of your back foot slightly inward. 4. Keeping your heels on the floor, bend your back knee slightly. You should feel a gentle stretch deep in your lower calf near your heel. 5. Hold this position for __________ seconds. Repeat __________ times. Complete this exercise __________ times a day. Strengthening exercises These exercises build strength and control of your ankle. Endurance is the ability to use your muscles for a long time, even after they get tired. Plantar flexion with band In this exercise, you push your toes downward, away from you, with an exercise band providing resistance. 1. Sit on the floor with your left / right leg extended. You may put a  pillow under your calf to give your foot more room to move. 2. Loop a rubber exercise band or tube around the ball of your left / right foot. The ball of your foot is on the walking surface, right under your toes. The band or tube should be slightly tense when your foot is relaxed. If the band or tube slips, you can put on your shoe or put a washcloth between the band and your foot to help it stay in place. 3. Slowly point your toes downward, pushing them away from you (plantar flexion). 4. Hold this position for __________ seconds. 5. Slowly release the tension in the band or tube, controlling smoothly until your foot is back to the starting position. 6. Repeat steps 1-5 with your left / right leg. Repeat __________ times. Complete this exercise __________ times a day. Eccentric heel drop  In this exercise, you stand and slowly raise your heel and then slowly lower it. This exercise lengthens the calf muscles (eccentric) while the heel bears weight. If this exercise is too easy, try doing it while wearing a backpack with weights in it. 1. Stand on a step with the balls of your feet. The ball of your foot is on the walking surface, right under your toes. ? Do not put your heels on the step. ? For balance, rest your hands on the wall or on a railing. 2. Rise up onto the balls of your feet. 3. Keeping your heels up, shift all of your weight to your left / right leg and pick up your other leg. 4. Slowly lower your left / right leg so your heel drops below the level of the step. 5. Put down your other foot before returning to the start position. If told by your health care provider, build up to: ? 3 sets of 15 repetitions while keeping your knees straight. ? 3 sets of 15 repetitions while keeping your knees slightly bent as far as told by your health care provider. Repeat __________ times. Complete this exercise __________ times a day. Balance exercises These exercises improve or maintain your  balance. Balance is important in preventing falls. Single leg stand If this exercise is too easy, you can try it with your eyes closed or while standing on a pillow. 1. Without shoes, stand near a railing or in a door frame. Hold on to the railing or door frame as needed. 2. Stand on your left / right foot. Keep your big toe down on the floor and try to keep your arch lifted. 3. Hold this position for __________ seconds. Repeat __________ times. Complete this exercise __________ times a day. This information is not intended to replace advice given to you by your health  care provider. Make sure you discuss any questions you have with your health care provider. Document Revised: 06/02/2018 Document Reviewed: 11/25/2017 Elsevier Patient Education  2020 [employer].

## 2019-05-20 NOTE — Progress Notes (Addendum)
New Patient Office Visit  Subjective:  Patient ID: Jean Vazquez, female    DOB: 1970/04/25  Age: 49 y.o. MRN: [id number]  CC:  Chief Complaint  Patient presents with  . Establish Care    New patient, c/o right foot pain started at heel but now all over x 3-4 months    HPI Jean Vazquez presents for establishment of care and follow-up of her anxiety with depression, elevated cholesterol, GERD and right heel pain.  She has been taking atorvastatin for her cholesterol without issue.  Past blood work is not available to see efficacy.  Ongoing problems with anxiety and depression responded to high-dose Zoloft and BuSpar.  She definitely would like to continue these medicines.  GERD has responded well to Protonix.  She takes Niferex for iron deficiency.  Over the last few weeks she has been having problems with her right posterior heel.  It seemed to start bothering her when she was driving for an hour and a half.  There was no trauma involved.  There is pain when she walks stands or goes to stand up on her toes.  Past Medical History:  Diagnosis Date  . Anemia   . Anxiety   . Depression     History reviewed. No pertinent surgical history.  Family History  Problem Relation Age of Onset  . Heart disease Mother   . Cancer Father   . Anxiety disorder Father     Social History   Socioeconomic History  . Marital status: Married    Spouse name: Not on file  . Number of children: Not on file  . Years of education: Not on file  . Highest education level: Not on file  Occupational History  . Not on file  Tobacco Use  . Smoking status: Former Games developer  . Smokeless tobacco: Never Used  Substance and Sexual Activity  . Alcohol use: No  . Drug use: No  . Sexual activity: Not on file  Other Topics Concern  . Not on file  Social History Narrative  . Not on file   Social Determinants of Health   Financial Resource Strain:   . Difficulty of Paying Living Expenses:   Food  Insecurity:   . Worried About Programme researcher, broadcasting/film/video in the Last Year:   . Barista in the Last Year:   Transportation Needs:   . Freight forwarder (Medical):   Marland Kitchen Lack of Transportation (Non-Medical):   Physical Activity:   . Days of Exercise per Week:   . Minutes of Exercise per Session:   Stress:   . Feeling of Stress :   Social Connections:   . Frequency of Communication with Friends and Family:   . Frequency of Social Gatherings with Friends and Family:   . Attends Religious Services:   . Active Member of Clubs or Organizations:   . Attends Banker Meetings:   Marland Kitchen Marital Status:   Intimate Partner Violence:   . Fear of Current or Ex-Partner:   . Emotionally Abused:   Marland Kitchen Physically Abused:   . Sexually Abused:     ROS Review of Systems  Constitutional: Negative.   HENT: Negative.   Eyes: Negative for photophobia.  Respiratory: Negative.   Cardiovascular: Negative.   Genitourinary: Negative.   Musculoskeletal: Positive for gait problem and myalgias.  Neurological: Negative for weakness and numbness.  Psychiatric/Behavioral: Positive for dysphoric mood. The patient is nervous/anxious.    Depression screen [facility] 2/9 05/20/2019 05/20/2019  Decreased Interest 0 0  Down, Depressed, Hopeless 0 0  PHQ - 2 Score 0 0  Altered sleeping 1 -  Tired, decreased energy 1 -  Change in appetite 3 -  Feeling bad or failure about yourself  1 -  Trouble concentrating 3 -  Moving slowly or fidgety/restless 1 -  Suicidal thoughts 0 -  PHQ-9 Score 10 -  Difficult doing work/chores Somewhat difficult -    Objective:   Today's Vitals: BP 136/78   Pulse 73   Temp 98.3 F (36.8 C) (Tympanic)   Ht 5\' 4"  (1.626 m)   SpO2 97%   BMI 27.15 kg/m   Physical Exam Vitals and nursing note reviewed.  Constitutional:      General: She is not in acute distress.    Appearance: Normal appearance. She is not ill-appearing or toxic-appearing.  HENT:     Head: Normocephalic  and atraumatic.     Right Ear: External ear normal.     Left Ear: External ear normal.  Eyes:     General: No scleral icterus.       Right eye: No discharge.        Left eye: No discharge.     Conjunctiva/sclera: Conjunctivae normal.  Pulmonary:     Effort: Pulmonary effort is normal.  Musculoskeletal:     Right lower leg: No edema.     Left lower leg: No edema.       Legs:  Neurological:     Mental Status: She is alert and oriented to person, place, and time.  Psychiatric:        Mood and Affect: Mood normal.        Behavior: Behavior normal.     Assessment & Plan:   Problem List Items Addressed This Visit      Digestive   Gastroesophageal reflux disease     Musculoskeletal and Integument   Achilles tendinitis of right lower extremity   Relevant Medications   diclofenac (VOLTAREN) 50 MG EC tablet   Other Relevant Orders   Ambulatory referral to Sports Medicine     Other   Healthcare maintenance - Primary   Relevant Orders   Ambulatory referral to Gynecology   Urinalysis, Routine w reflex microscopic (Completed)   TSH (Completed)   Iron deficiency   Relevant Medications   iron polysaccharides (NIFEREX) 150 MG capsule   Other Relevant Orders   CBC (Completed)   Iron, TIBC and Ferritin Panel (Completed)   Depression with anxiety   Elevated cholesterol   Relevant Medications   atorvastatin (LIPITOR) 40 MG tablet   Other Relevant Orders   Comprehensive metabolic panel (Completed)   Lipid panel (Completed)   LDL cholesterol, direct (Completed)      Outpatient Encounter Medications as of 05/20/2019  Medication Sig  . atorvastatin (LIPITOR) 40 MG tablet Take 1 tablet (40 mg total) by mouth daily.  . iron polysaccharides (NIFEREX) 150 MG capsule Take 1 capsule (150 mg total) by mouth daily.  . [DISCONTINUED] aspirin 81 MG chewable tablet Chew 81 mg by mouth daily.  . [DISCONTINUED] atorvastatin (LIPITOR) 40 MG tablet Take 40 mg by mouth daily.  .  [DISCONTINUED] busPIRone (BUSPAR) 15 MG tablet Take 15 mg by mouth 2 (two) times daily.  . [DISCONTINUED] busPIRone (BUSPAR) 15 MG tablet Take 1 tablet (15 mg total) by mouth 2 (two) times daily.  . [DISCONTINUED] ibuprofen (ADVIL,MOTRIN) 800 MG tablet Take 1 tablet (800 mg total) by mouth 3 (three) times daily.  . [  DISCONTINUED] iron polysaccharides (NIFEREX) 150 MG capsule Take 150 mg by mouth daily.  . [DISCONTINUED] pantoprazole (PROTONIX) 40 MG tablet Take 40 mg by mouth daily.  . [DISCONTINUED] pantoprazole (PROTONIX) 40 MG tablet Take 1 tablet (40 mg total) by mouth daily.  . [DISCONTINUED] sertraline (ZOLOFT) 100 MG tablet Take 200 mg by mouth daily.  . [DISCONTINUED] sertraline (ZOLOFT) 100 MG tablet Take 2 tablets (200 mg total) by mouth daily.  . diclofenac (VOLTAREN) 50 MG EC tablet Take 1 tablet (50 mg total) by mouth 2 (two) times daily. With food for 15 days.   No facility-administered encounter medications on file as of 05/20/2019.    Follow-up: Return in about 6 months (around 11/20/2019), or call if achilles isn't improving in the next few weeks.Libby Maw, MD

## 2019-05-21 ENCOUNTER — Other Ambulatory Visit: Payer: Self-pay

## 2019-05-21 ENCOUNTER — Other Ambulatory Visit (INDEPENDENT_AMBULATORY_CARE_PROVIDER_SITE_OTHER): Payer: 59

## 2019-05-21 ENCOUNTER — Telehealth: Payer: Self-pay | Admitting: Family Medicine

## 2019-05-21 DIAGNOSIS — F418 Other specified anxiety disorders: Secondary | ICD-10-CM

## 2019-05-21 DIAGNOSIS — K219 Gastro-esophageal reflux disease without esophagitis: Secondary | ICD-10-CM

## 2019-05-21 DIAGNOSIS — E7800 Pure hypercholesterolemia, unspecified: Secondary | ICD-10-CM

## 2019-05-21 DIAGNOSIS — E611 Iron deficiency: Secondary | ICD-10-CM | POA: Diagnosis not present

## 2019-05-21 DIAGNOSIS — Z0000 Encounter for general adult medical examination without abnormal findings: Secondary | ICD-10-CM

## 2019-05-21 LAB — URINALYSIS, ROUTINE W REFLEX MICROSCOPIC
Bilirubin Urine: NEGATIVE
Ketones, ur: NEGATIVE
Leukocytes,Ua: NEGATIVE
Nitrite: NEGATIVE
Specific Gravity, Urine: 1.025 (ref 1.000–1.030)
Total Protein, Urine: NEGATIVE
Urine Glucose: NEGATIVE
Urobilinogen, UA: 0.2 (ref 0.0–1.0)
pH: 6.5 (ref 5.0–8.0)

## 2019-05-21 LAB — LIPID PANEL
Cholesterol: 144 mg/dL (ref 0–200)
HDL: 49.1 mg/dL (ref >39.00)
LDL Cholesterol: 68 mg/dL (ref 0–99)
NonHDL: 94.72
Total CHOL/HDL Ratio: 3
Triglycerides: 132 mg/dL (ref 0.0–149.0)
VLDL: 26.4 mg/dL (ref 0.0–40.0)

## 2019-05-21 LAB — COMPREHENSIVE METABOLIC PANEL
ALT: 17 U/L (ref 0–35)
AST: 15 U/L (ref 0–37)
Albumin: 4.1 g/dL (ref 3.5–5.2)
Alkaline Phosphatase: 58 U/L (ref 39–117)
BUN: 14 mg/dL (ref 6–23)
CO2: 27 mEq/L (ref 19–32)
Calcium: 9.5 mg/dL (ref 8.4–10.5)
Chloride: 107 mEq/L (ref 96–112)
Creatinine, Ser: 0.86 mg/dL (ref 0.40–1.20)
GFR: 70.41 mL/min (ref >60.00)
Glucose, Bld: 102 mg/dL — ABNORMAL HIGH (ref 70–99)
Potassium: 4.8 mEq/L (ref 3.5–5.1)
Sodium: 139 mEq/L (ref 135–145)
Total Bilirubin: 0.4 mg/dL (ref 0.2–1.2)
Total Protein: 6.5 g/dL (ref 6.0–8.3)

## 2019-05-21 LAB — CBC
HCT: 33 % — ABNORMAL LOW (ref 36.0–46.0)
Hemoglobin: 10.4 g/dL — ABNORMAL LOW (ref 12.0–15.0)
MCHC: 31.5 g/dL (ref 30.0–36.0)
MCV: 72.5 fl — ABNORMAL LOW (ref 78.0–100.0)
Platelets: 342 10*3/uL (ref 150.0–400.0)
RBC: 4.56 Mil/uL (ref 3.87–5.11)
RDW: 17.8 % — ABNORMAL HIGH (ref 11.5–15.5)
WBC: 6.5 10*3/uL (ref 4.0–10.5)

## 2019-05-21 LAB — TSH: TSH: 0.93 u[IU]/mL (ref 0.35–4.50)

## 2019-05-21 LAB — LDL CHOLESTEROL, DIRECT: Direct LDL: 77 mg/dL

## 2019-05-21 NOTE — Telephone Encounter (Signed)
Patient calling and has some questions about medications. Please return patient call.

## 2019-05-21 NOTE — Telephone Encounter (Signed)
Patient calling wanting to know if Dr. Doreene Burke could change Zoloft 100 mg, Protonix 40mg , Buspar 15mg , and Aspirin 81 mg all to a 90 day supply? She states that this will be cheaper for her at the pharmacy.

## 2019-05-22 LAB — IRON,TIBC AND FERRITIN PANEL
%SAT: 6 % (calc) — ABNORMAL LOW (ref 16–45)
Ferritin: 5 ng/mL — ABNORMAL LOW (ref 16–232)
Iron: 24 ug/dL — ABNORMAL LOW (ref 40–190)
TIBC: 412 mcg/dL (calc) (ref 250–450)

## 2019-05-22 MED ORDER — SERTRALINE HCL 100 MG PO TABS: 200.0000 mg | ORAL_TABLET | Freq: Every day | ORAL | 1 refills | Status: DC

## 2019-05-22 MED ORDER — BUSPIRONE HCL 15 MG PO TABS: 15.0000 mg | ORAL_TABLET | Freq: Two times a day (BID) | ORAL | 1 refills | Status: DC

## 2019-05-22 MED ORDER — ASPIRIN 81 MG PO CHEW: 81.0000 mg | CHEWABLE_TABLET | Freq: Every day | ORAL | 1 refills | Status: DC

## 2019-05-22 MED ORDER — PANTOPRAZOLE SODIUM 40 MG PO TBEC: 40.0000 mg | DELAYED_RELEASE_TABLET | Freq: Every day | ORAL | 1 refills | Status: DC

## 2019-05-22 NOTE — Telephone Encounter (Signed)
Rx changed and sent to pharmacy as pt requested.

## 2019-05-22 NOTE — Telephone Encounter (Signed)
Hi P,  She had originally asked for 30 day supplies and now wants 90 day supplies. If you have time that would be great!

## 2019-05-22 NOTE — Telephone Encounter (Signed)
Pt aware that we still waiting to hear from Dr. Doreene Burke

## 2019-05-22 NOTE — Telephone Encounter (Signed)
Pt calling back and wanted to see if there was any update, please advise

## 2019-06-03 ENCOUNTER — Telehealth: Payer: Self-pay | Admitting: Family Medicine

## 2019-06-03 NOTE — Telephone Encounter (Signed)
Called to check on patient, spoke with patient and her husband. She states that she started feeling bad on Saturday the day she was tested fever, chills, vomiting, no taste or smell, body aches and very tired. Patient agrees to continue with fluids that she can hold down and get plenty of rest. No concerns they just wanted to make you aware that she was positive.

## 2019-06-03 NOTE — Telephone Encounter (Signed)
Patient husband is returning the call and wanted to let DR. Doreene Burke know that patient tested positive on 4/3. CB is [phone]

## 2019-06-08 ENCOUNTER — Telehealth: Payer: Self-pay | Admitting: Family Medicine

## 2019-06-08 NOTE — Telephone Encounter (Signed)
Dr. Kremer please advise message below.  

## 2019-06-08 NOTE — Telephone Encounter (Signed)
Patient is calling and wanted to let Dr. Doreene Burke know that her foot is still bothering her and if she can be referred to see someone. CB is [phone]

## 2019-06-09 NOTE — Addendum Note (Signed)
Addended by: Andrez Grime on: 06/09/2019 08:31 AM   Modules accepted: Orders

## 2019-06-09 NOTE — Telephone Encounter (Signed)
Done

## 2019-06-11 ENCOUNTER — Ambulatory Visit: Payer: 59

## 2019-06-18 ENCOUNTER — Ambulatory Visit (INDEPENDENT_AMBULATORY_CARE_PROVIDER_SITE_OTHER): Payer: 59 | Admitting: Sports Medicine

## 2019-06-18 ENCOUNTER — Encounter: Payer: Self-pay | Admitting: Sports Medicine

## 2019-06-18 ENCOUNTER — Other Ambulatory Visit: Payer: Self-pay

## 2019-06-18 VITALS — BP 126/86 | Ht 65.0 in | Wt 180.0 lb

## 2019-06-18 DIAGNOSIS — M7661 Achilles tendinitis, right leg: Secondary | ICD-10-CM

## 2019-06-18 NOTE — Patient Instructions (Signed)
  It was nice meeting you today. You have Achilles tendinitis.  I want you to wear your walking boot for the next 2 weeks at work and with prolonged walking.  Were going to put a heel lift in your boot and give you another heel lift for your left shoe.  In 2 weeks, you can try to discontinue the boot and use the heel lift in a regular shoe but if you are still having pain I want you to wear the boot for 1 additional week.  I want you to ice the back of your heel for 10 minutes at night every day.  Use your anti-inflammatory medicine only as needed for pain.  Be sure to do your home exercises every single day.  See me again in 1 month.  If you are not improving then physical therapy can sometimes be of great benefit for this problem.

## 2019-06-18 NOTE — Progress Notes (Signed)
   Subjective:    Patient ID: Jean Vazquez, female    DOB: November 25, 1970, 49 y.o.   MRN: [id number]  HPI chief complaint: Right Achilles pain  49 year old female comes in today complaining of 5 months of posterior right heel pain.  Symptoms began acutely while driving.  She began to notice discomfort along the mid substance of the right Achilles tendon.  Since then, she has had worsening symptoms to the point where she is now limping.  She is also beginning to get pain along the lateral ankle and lateral aspect of the right lower leg.  Symptoms improve at rest.  She has noticed some swelling.  Her primary care physician placed her on diclofenac about a month ago which has been helpful.  She denies any prior occurrences.  No recent trauma.  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  Past medical history reviewed Medications reviewed Allergies reviewed    Review of Systems    As above Objective:   Physical Exam  Well-developed, well-nourished.  No acute distress.  Awake alert and oriented x3.  Vital signs reviewed.  Right Achilles: There is mild swelling of the midportion of the Achilles tendon.  Tenderness to palpation here as well.  No other bony or soft tissue tenderness around the foot or ankle.  Full ankle range of motion.  No effusion.  Neurovascularly intact distally.  Walking with a slight limp.  Right calf: Negative Homans.  No swelling.  Bedside MSK ultrasound of the right Achilles tendon was performed.  There is slight thickening of the midportion of the Achilles tendon with surrounding fluid.  Findings consistent with Achilles tendinitis/tendinopathy.      Assessment & Plan:   Right Achilles tendinitis/tendinopathy  Since the patient is limping, I have elected to put her in a short cam walker for the next 2 weeks.  We will put a 5/16 inch heel lifts in both the cam walker and a regular pair of shoes.  In 2 weeks she will try to transition from the Cam walker into  her shoes with this lift.  If she is still having pain then she will resume her cam walker until follow-up with me in 4 weeks.  We have also educated her on Alfredson heel drop exercises with instructions to do these daily.  She will also ice the affected area 10 minutes at the end of the day.  Although her diclofenac has been helpful, she has been taking 50 mg twice daily for 1 month.  I would like for her to wean to taking this only as needed.  If symptoms persist despite today's treatment then consider formal physical therapy or topical nitroglycerin patches.  Call with questions or concerns in the interim.

## 2019-07-16 ENCOUNTER — Ambulatory Visit: Payer: 59 | Admitting: Sports Medicine

## 2019-07-21 ENCOUNTER — Ambulatory Visit: Payer: 59 | Admitting: Sports Medicine

## 2019-07-21 ENCOUNTER — Other Ambulatory Visit: Payer: Self-pay

## 2019-07-21 VITALS — BP 130/90 | Ht 65.0 in | Wt 179.2 lb

## 2019-07-21 DIAGNOSIS — M7661 Achilles tendinitis, right leg: Secondary | ICD-10-CM | POA: Diagnosis not present

## 2019-07-21 NOTE — Progress Notes (Signed)
   Subjective:    Patient ID: Jean Vazquez, female    DOB: 1970/04/14, 49 y.o.   MRN: [id number]  HPI   Patient comes in today for follow-up on right Achilles tendinitis.  She is doing much better.  She wore the cam walker for 2 weeks.  Although she was advised to transition her heel lifts into her regular walking shoes, she has not done that.  Nonetheless, she has continued to improve.  She has not taken any NSAIDs for the past several weeks.  She is faithfully doing her home exercises and feels like they are helpful.  She would like to return to some recreational walking if possible.    Review of Systems    As above Objective:   Physical Exam  Well-developed, well-nourished.  No acute distress.  Awake alert and oriented x3.  Right Achilles tendon: There is still fusiform swelling in the mid substance of the Achilles tendon but it is nontender to palpation.  No soft tissue swelling.  Good strength.  Neurovascular intact distally.  Walking without a limp.      Assessment & Plan:   Improving right Achilles tendinitis  Patient has made excellent progress to date.  I think she can return to some limited recreational walking.  She will slowly increase this as tolerated.  She may continue to use ice for any Achilles soreness that she may have after exercise.  She may also want to start off exercising with her heel lifts in her tennis shoes.  Given her improvement I do not see the need for any further work-up or treatment at this time.  Patient will follow up as needed.

## 2019-09-02 ENCOUNTER — Encounter: Payer: Self-pay | Admitting: Family Medicine

## 2019-09-21 LAB — HM MAMMOGRAPHY

## 2019-09-22 ENCOUNTER — Telehealth: Payer: Self-pay | Admitting: Family Medicine

## 2019-09-22 NOTE — Telephone Encounter (Signed)
Patient's husband dropped off Health Review paperwork for Dr. Doreene Burke to sign. Paperwork is in his folder in front office. Patient's husband was informed Dr. Doreene Burke is out of the office this week.

## 2019-10-07 NOTE — Telephone Encounter (Signed)
Form ready for pick up. Patient aware. ? ?

## 2019-10-07 NOTE — Telephone Encounter (Signed)
Patient is calling back regarding paperwork and wanted an update to see when paperwork is complete and ready to be picked up, please advise. CB is [phone].

## 2019-11-20 ENCOUNTER — Telehealth: Payer: Self-pay | Admitting: Family Medicine

## 2019-11-20 NOTE — Telephone Encounter (Signed)
Patient is calling and wanted to see if she needed to come in and do labs before appointment on 9/28, please advise. CB is [phone]

## 2019-11-23 NOTE — Telephone Encounter (Signed)
Patient aware that Dr. Doreene Burke prefers for patient to come for visit then have labs afterwards.

## 2019-11-24 ENCOUNTER — Ambulatory Visit: Payer: 59 | Admitting: Family Medicine

## 2019-11-24 ENCOUNTER — Encounter: Payer: Self-pay | Admitting: Family Medicine

## 2019-11-24 ENCOUNTER — Other Ambulatory Visit: Payer: Self-pay

## 2019-11-24 VITALS — BP 124/76 | HR 76 | Temp 97.8°F | Ht 65.0 in | Wt 177.4 lb

## 2019-11-24 DIAGNOSIS — Z23 Encounter for immunization: Secondary | ICD-10-CM

## 2019-11-24 DIAGNOSIS — K5903 Drug induced constipation: Secondary | ICD-10-CM

## 2019-11-24 DIAGNOSIS — E7800 Pure hypercholesterolemia, unspecified: Secondary | ICD-10-CM | POA: Diagnosis not present

## 2019-11-24 DIAGNOSIS — N393 Stress incontinence (female) (male): Secondary | ICD-10-CM | POA: Diagnosis not present

## 2019-11-24 DIAGNOSIS — N920 Excessive and frequent menstruation with regular cycle: Secondary | ICD-10-CM | POA: Insufficient documentation

## 2019-11-24 DIAGNOSIS — R1013 Epigastric pain: Secondary | ICD-10-CM

## 2019-11-24 DIAGNOSIS — T887XXA Unspecified adverse effect of drug or medicament, initial encounter: Secondary | ICD-10-CM | POA: Diagnosis not present

## 2019-11-24 DIAGNOSIS — E611 Iron deficiency: Secondary | ICD-10-CM | POA: Diagnosis not present

## 2019-11-24 DIAGNOSIS — F418 Other specified anxiety disorders: Secondary | ICD-10-CM

## 2019-11-24 LAB — URINALYSIS, ROUTINE W REFLEX MICROSCOPIC
Bilirubin Urine: NEGATIVE
Hgb urine dipstick: NEGATIVE
Ketones, ur: NEGATIVE
Leukocytes,Ua: NEGATIVE
Nitrite: NEGATIVE
RBC / HPF: NONE SEEN (ref 0–?)
Specific Gravity, Urine: 1.01 (ref 1.000–1.030)
Total Protein, Urine: NEGATIVE
Urine Glucose: NEGATIVE
Urobilinogen, UA: 0.2 (ref 0.0–1.0)
pH: 6.5 (ref 5.0–8.0)

## 2019-11-24 LAB — CBC
HCT: 40 % (ref 36.0–46.0)
Hemoglobin: 13 g/dL (ref 12.0–15.0)
MCHC: 32.6 g/dL (ref 30.0–36.0)
MCV: 83.7 fl (ref 78.0–100.0)
Platelets: 292 10*3/uL (ref 150.0–400.0)
RBC: 4.77 Mil/uL (ref 3.87–5.11)
RDW: 14.6 % (ref 11.5–15.5)
WBC: 7.9 10*3/uL (ref 4.0–10.5)

## 2019-11-24 LAB — LDL CHOLESTEROL, DIRECT: Direct LDL: 77 mg/dL

## 2019-11-24 LAB — VITAMIN B12: Vitamin B-12: 379 pg/mL (ref 211–911)

## 2019-11-24 LAB — AMYLASE: Amylase: 38 U/L (ref 27–131)

## 2019-11-24 MED ORDER — DOCUSATE SODIUM 100 MG PO CAPS: 100.0000 mg | ORAL_CAPSULE | Freq: Two times a day (BID) | ORAL | 0 refills | Status: DC

## 2019-11-24 MED ORDER — POLYETHYLENE GLYCOL 3350 17 GM/SCOOP PO POWD: 17.0000 g | Freq: Two times a day (BID) | ORAL | 1 refills | Status: DC | PRN

## 2019-11-24 NOTE — Progress Notes (Signed)
Established Patient Office Visit  Subjective:  Patient ID: Jean Vazquez, female    DOB: [birth year]  Age: 49 y.o. MRN: [id number]  CC:  Chief Complaint  Patient presents with  . Follow-up    6 month follow up, patient started with lower back pains yesterday.     HPI Jean Vazquez presents for routine follow-up.  However she developed lower back pain yesterday that has now moved around to her abdomen.  She is bloated.  She has been constipated.  She is experiencing some abdominal cramping.  She is taking her iron twice daily now.  Standing history of urine loss with cough or sneeze.  Status post 3 vaginal deliveries.  She has heavy menstrual flow with every 3 week menses.  No recent GYN check.  Protonix has been helping her reflux.  History of hiatal hernia she tells me.  Past Medical History:  Diagnosis Date  . Acid reflux   . Anemia   . Anxiety   . Depression     No past surgical history on file.  Family History  Problem Relation Age of Onset  . Heart disease Mother   . Diabetes Mother   . Cancer Father        Lung  . Anxiety disorder Father   . Diabetes Father   . Diabetes type I Daughter     Social History   Socioeconomic History  . Marital status: Married    Spouse name: Not on file  . Number of children: Not on file  . Years of education: Not on file  . Highest education level: Not on file  Occupational History  . Not on file  Tobacco Use  . Smoking status: Former Games developer  . Smokeless tobacco: Never Used  Vaping Use  . Vaping Use: Never assessed  Substance and Sexual Activity  . Alcohol use: No  . Drug use: No  . Sexual activity: Not on file  Other Topics Concern  . Not on file  Social History Narrative  . Not on file   Social Determinants of Health   Financial Resource Strain:   . Difficulty of Paying Living Expenses: Not on file  Food Insecurity:   . Worried About Programme researcher, broadcasting/film/video in the Last Year: Not on file  . Ran Out of Food in  the Last Year: Not on file  Transportation Needs:   . Lack of Transportation (Medical): Not on file  . Lack of Transportation (Non-Medical): Not on file  Physical Activity:   . Days of Exercise per Week: Not on file  . Minutes of Exercise per Session: Not on file  Stress:   . Feeling of Stress : Not on file  Social Connections:   . Frequency of Communication with Friends and Family: Not on file  . Frequency of Social Gatherings with Friends and Family: Not on file  . Attends Religious Services: Not on file  . Active Member of Clubs or Organizations: Not on file  . Attends Banker Meetings: Not on file  . Marital Status: Not on file  Intimate Partner Violence:   . Fear of Current or Ex-Partner: Not on file  . Emotionally Abused: Not on file  . Physically Abused: Not on file  . Sexually Abused: Not on file    Outpatient Medications Prior to Visit  Medication Sig Dispense Refill  . aspirin 81 MG chewable tablet Chew 1 tablet (81 mg total) by mouth daily. 90 tablet 1  . atorvastatin (LIPITOR)  40 MG tablet Take 1 tablet (40 mg total) by mouth daily. 90 tablet 1  . busPIRone (BUSPAR) 15 MG tablet Take 1 tablet (15 mg total) by mouth 2 (two) times daily. 180 tablet 1  . iron polysaccharides (NIFEREX) 150 MG capsule Take 1 capsule (150 mg total) by mouth daily. 30 capsule 5  . pantoprazole (PROTONIX) 40 MG tablet Take 1 tablet (40 mg total) by mouth daily. 90 tablet 1  . sertraline (ZOLOFT) 100 MG tablet Take 2 tablets (200 mg total) by mouth daily. 180 tablet 1   No facility-administered medications prior to visit.    No Known Allergies  ROS Review of Systems  Constitutional: Negative.   HENT: Negative.   Eyes: Negative for photophobia and visual disturbance.  Respiratory: Negative.   Cardiovascular: Negative.   Gastrointestinal: Positive for abdominal distention and constipation. Negative for anal bleeding, blood in stool, nausea and vomiting.  Endocrine: Negative  for polyphagia and polyuria.  Genitourinary: Negative.  Negative for difficulty urinating, frequency and urgency.  Musculoskeletal: Negative for gait problem and joint swelling.  Allergic/Immunologic: Negative for immunocompromised state.  Neurological: Negative for tremors and speech difficulty.  Hematological: Does not bruise/bleed easily.  Psychiatric/Behavioral: Negative.       Objective:    Physical Exam Vitals and nursing note reviewed.  Constitutional:      General: She is not in acute distress.    Appearance: Normal appearance. She is not ill-appearing, toxic-appearing or diaphoretic.  HENT:     Head: Normocephalic and atraumatic.     Right Ear: External ear normal.     Left Ear: External ear normal.     Mouth/Throat:     Mouth: Mucous membranes are moist.     Pharynx: Oropharynx is clear. No oropharyngeal exudate or posterior oropharyngeal erythema.  Eyes:     General:        Right eye: No discharge.        Left eye: No discharge.     Extraocular Movements: Extraocular movements intact.     Conjunctiva/sclera: Conjunctivae normal.     Pupils: Pupils are equal, round, and reactive to light.  Cardiovascular:     Rate and Rhythm: Normal rate and regular rhythm.  Pulmonary:     Effort: Pulmonary effort is normal.     Breath sounds: Normal breath sounds.  Abdominal:     General: There is distension.     Tenderness: There is abdominal tenderness. There is no right CVA tenderness, left CVA tenderness, guarding or rebound.     Hernia: No hernia is present.  Musculoskeletal:     Cervical back: No rigidity or tenderness.     Right lower leg: No edema.     Left lower leg: No edema.  Lymphadenopathy:     Cervical: No cervical adenopathy.  Skin:    General: Skin is warm and dry.  Neurological:     Mental Status: She is alert and oriented to person, place, and time.  Psychiatric:        Mood and Affect: Mood normal.        Behavior: Behavior normal.     BP 124/76    Pulse 76   Temp 97.8 F (36.6 C) (Tympanic)   Ht 5\' 5"  (1.651 m)   Wt 177 lb 6.4 oz (80.5 kg)   SpO2 97%   BMI 29.52 kg/m  Wt Readings from Last 3 Encounters:  11/24/19 177 lb 6.4 oz (80.5 kg)  07/21/19 179 lb 3.2 oz (81.3 kg)  06/18/19 180 lb (81.6 kg)     Health Maintenance Due  Topic Date Due  . Hepatitis C Screening  Never done  . HIV Screening  Never done  . PAP SMEAR-Modifier  Never done  . INFLUENZA VACCINE  09/27/2019    There are no preventive care reminders to display for this patient.  Lab Results  Component Value Date   TSH 0.93 05/21/2019   Lab Results  Component Value Date   WBC 6.5 05/21/2019   HGB 10.4 (L) 05/21/2019   HCT 33.0 (L) 05/21/2019   MCV 72.5 (L) 05/21/2019   PLT 342.0 05/21/2019   Lab Results  Component Value Date   NA 139 05/21/2019   K 4.8 05/21/2019   CO2 27 05/21/2019   GLUCOSE 102 (H) 05/21/2019   BUN 14 05/21/2019   CREATININE 0.86 05/21/2019   BILITOT 0.4 05/21/2019   ALKPHOS 58 05/21/2019   AST 15 05/21/2019   ALT 17 05/21/2019   PROT 6.5 05/21/2019   ALBUMIN 4.1 05/21/2019   CALCIUM 9.5 05/21/2019   ANIONGAP 7 09/23/2014   GFR 70.41 05/21/2019   Lab Results  Component Value Date   CHOL 144 05/21/2019   Lab Results  Component Value Date   HDL 49.10 05/21/2019   Lab Results  Component Value Date   LDLCALC 68 05/21/2019   Lab Results  Component Value Date   TRIG 132.0 05/21/2019   Lab Results  Component Value Date   CHOLHDL 3 05/21/2019   No results found for: HGBA1C    Assessment & Plan:   Problem List Items Addressed This Visit      Digestive   Drug-induced constipation   Relevant Medications   polyethylene glycol powder (GLYCOLAX/MIRALAX) 17 GM/SCOOP powder   docusate sodium (COLACE) 100 MG capsule     Other   Iron deficiency - Primary   Relevant Orders   CBC   Iron, TIBC and Ferritin Panel   Urinalysis, Routine w reflex microscopic   Depression with anxiety   Elevated cholesterol    Relevant Orders   LDL cholesterol, direct   Epigastric pain   Relevant Orders   Amylase   Stress incontinence   Relevant Orders   Urinalysis, Routine w reflex microscopic   Menorrhagia with regular cycle   Relevant Orders   Ambulatory referral to Gynecology   Medication side effect   Relevant Orders   Vitamin B12      Meds ordered this encounter  Medications  . polyethylene glycol powder (GLYCOLAX/MIRALAX) 17 GM/SCOOP powder    Sig: Take 17 g by mouth 2 (two) times daily as needed.    Dispense:  3350 g    Refill:  1  . docusate sodium (COLACE) 100 MG capsule    Sig: Take 1 capsule (100 mg total) by mouth 2 (two) times daily. With iron tablets    Dispense:  10 capsule    Refill:  0    Follow-up: Return in about 6 months (around 05/23/2020), or if symptoms worsen or fail to improve.  Patient was given information on constipation.  Believe that it is secondary to increased iron therapy for her iron deficiency anemia.  She will start Colace and take it with each iron dose.  She will use MiraLAX as needed.  She was given information on the Kegel exercises.  Referred for GYN consultation regarding heavy bleeding every 3 weeks with menses.  Mliss Sax, MD

## 2019-11-24 NOTE — Addendum Note (Signed)
Addended by: Waymond Cera on: 11/24/2019 10:40 AM   Modules accepted: Orders

## 2019-11-24 NOTE — Patient Instructions (Signed)
Constipation, Adult Constipation is when a person has fewer bowel movements in a week than normal, has difficulty having a bowel movement, or has stools that are dry, hard, or larger than normal. Constipation may be caused by an underlying condition. It may become worse with age if a person takes certain medicines and does not take in enough fluids. Follow these instructions at home: Eating and drinking   Eat foods that have a lot of fiber, such as fresh fruits and vegetables, whole grains, and beans.  Limit foods that are high in fat, low in fiber, or overly processed, such as french fries, hamburgers, cookies, candies, and soda.  Drink enough fluid to keep your urine clear or pale yellow. General instructions  Exercise regularly or as told by your health care provider.  Go to the restroom when you have the urge to go. Do not hold it in.  Take over-the-counter and prescription medicines only as told by your health care provider. These include any fiber supplements.  Practice pelvic floor retraining exercises, such as deep breathing while relaxing the lower abdomen and pelvic floor relaxation during bowel movements.  Watch your condition for any changes.  Keep all follow-up visits as told by your health care provider. This is important. Contact a health care provider if:  You have pain that gets worse.  You have a fever.  You do not have a bowel movement after 4 days.  You vomit.  You are not hungry.  You lose weight.  You are bleeding from the anus.  You have thin, pencil-like stools. Get help right away if:  You have a fever and your symptoms suddenly get worse.  You leak stool or have blood in your stool.  Your abdomen is bloated.  You have severe pain in your abdomen.  You feel dizzy or you faint. This information is not intended to replace advice given to you by your health care provider. Make sure you discuss any questions you have with your health care  provider. Document Revised: 01/25/2017 Document Reviewed: 08/03/2015 Elsevier Patient Education  2020 [employer]  Kegel Exercises  Kegel exercises can help strengthen your pelvic floor muscles. The pelvic floor is a group of muscles that support your rectum, small intestine, and bladder. In females, pelvic floor muscles also help support the womb (uterus). These muscles help you control the flow of urine and stool. Kegel exercises are painless and simple, and they do not require any equipment. Your provider may suggest Kegel exercises to:  Improve bladder and bowel control.  Improve sexual response.  Improve weak pelvic floor muscles after surgery to remove the uterus (hysterectomy) or pregnancy (females).  Improve weak pelvic floor muscles after prostate gland removal or surgery (males). Kegel exercises involve squeezing your pelvic floor muscles, which are the same muscles you squeeze when you try to stop the flow of urine or keep from passing gas. The exercises can be done while sitting, standing, or lying down, but it is best to vary your position. Exercises How to do Kegel exercises: 1. Squeeze your pelvic floor muscles tight. You should feel a tight lift in your rectal area. If you are a female, you should also feel a tightness in your vaginal area. Keep your stomach, buttocks, and legs relaxed. 2. Hold the muscles tight for up to 10 seconds. 3. Breathe normally. 4. Relax your muscles. 5. Repeat as told by your health care provider. Repeat this exercise daily as told by your health care provider. Continue  to do this exercise for at least 4-6 weeks, or for as long as told by your health care provider. You may be referred to a physical therapist who can help you learn more about how to do Kegel exercises. Depending on your condition, your health care provider may recommend:  Varying how long you squeeze your muscles.  Doing several sets of exercises every day.  Doing exercises  for several weeks.  Making Kegel exercises a part of your regular exercise routine. This information is not intended to replace advice given to you by your health care provider. Make sure you discuss any questions you have with your health care provider. Document Revised: 10/02/2017 Document Reviewed: 10/02/2017 Elsevier Patient Education  2020 [employer].

## 2019-11-25 LAB — IRON,TIBC AND FERRITIN PANEL
%SAT: 50 % (calc) — ABNORMAL HIGH (ref 16–45)
Ferritin: 15 ng/mL — ABNORMAL LOW (ref 16–232)
Iron: 218 ug/dL — ABNORMAL HIGH (ref 40–190)
TIBC: 433 mcg/dL (calc) (ref 250–450)

## 2019-12-02 ENCOUNTER — Ambulatory Visit: Payer: 59 | Admitting: Obstetrics and Gynecology

## 2019-12-02 ENCOUNTER — Other Ambulatory Visit: Payer: Self-pay

## 2019-12-02 ENCOUNTER — Encounter: Payer: Self-pay | Admitting: Obstetrics and Gynecology

## 2019-12-02 VITALS — BP 138/88 | Ht 63.0 in | Wt 177.0 lb

## 2019-12-02 DIAGNOSIS — R350 Frequency of micturition: Secondary | ICD-10-CM | POA: Diagnosis not present

## 2019-12-02 DIAGNOSIS — Z124 Encounter for screening for malignant neoplasm of cervix: Secondary | ICD-10-CM | POA: Diagnosis not present

## 2019-12-02 DIAGNOSIS — R32 Unspecified urinary incontinence: Secondary | ICD-10-CM

## 2019-12-02 DIAGNOSIS — Z01419 Encounter for gynecological examination (general) (routine) without abnormal findings: Secondary | ICD-10-CM | POA: Diagnosis not present

## 2019-12-02 NOTE — Addendum Note (Signed)
Addended by: Dayna Barker on: 12/02/2019 04:38 PM   Modules accepted: Orders

## 2019-12-02 NOTE — Progress Notes (Signed)
Jean Vazquez December 14, 1970 [id number]  SUBJECTIVE:  49 y.o. [id number] female new patient here for an routine gynecologic exam and Pap smear. She has no gynecologic concerns.  She does note some urinary leakage which is fairly constant but also occurs with certain body movements or laughing/coughing, which has been present more or less since her last delivery.  She has been having some lower back pain and abdominal bloating for the last week and has started on MiraLAX twice daily per her primary doctor's recommendations to address constipation, but has found her stool to become loose and gassy.  Current Outpatient Medications  Medication Sig Dispense Refill  . aspirin 81 MG chewable tablet Chew 1 tablet (81 mg total) by mouth daily. 90 tablet 1  . atorvastatin (LIPITOR) 40 MG tablet Take 1 tablet (40 mg total) by mouth daily. 90 tablet 1  . busPIRone (BUSPAR) 15 MG tablet Take 1 tablet (15 mg total) by mouth 2 (two) times daily. 180 tablet 1  . docusate sodium (COLACE) 100 MG capsule Take 1 capsule (100 mg total) by mouth 2 (two) times daily. With iron tablets 10 capsule 0  . iron polysaccharides (NIFEREX) 150 MG capsule Take 1 capsule (150 mg total) by mouth daily. 30 capsule 5  . pantoprazole (PROTONIX) 40 MG tablet Take 1 tablet (40 mg total) by mouth daily. 90 tablet 1  . polyethylene glycol powder (GLYCOLAX/MIRALAX) 17 GM/SCOOP powder Take 17 g by mouth 2 (two) times daily as needed. 3350 g 1  . sertraline (ZOLOFT) 100 MG tablet Take 2 tablets (200 mg total) by mouth daily. 180 tablet 1   No current facility-administered medications for this visit.   Allergies: Patient has no known allergies.  Patient's last menstrual period was 11/18/2019.  Past medical history,surgical history, problem list, medications, allergies, family history and social history were all reviewed and documented as reviewed in the EPIC chart.  ROS: Pertinent positives and negatives as reviewed in HPI.   OBJECTIVE:   BP 138/88   Ht 5\' 3"  (1.6 m)   Wt 177 lb (80.3 kg)   LMP 11/18/2019   BMI 31.35 kg/m  The patient appears well, alert, oriented, in no distress. ENT normal.   Lungs are clear, good air entry, no wheezes, rhonchi or rales. S1 and S2 normal, no murmurs, regular rate and rhythm.  Abdomen soft without tenderness, guarding, mass or organomegaly.  Bloated and moderately distended, tympanic to percussion. Neurological is normal, no focal findings.  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 49 here for annual gynecologic exam  PLAN:   1. No hormonal or menstrual concerns.  Has regular periods, passes small clots but does not think she has had heavy flow. 2. Pap smear 2016.  Repeat Pap smear is collected today. 3. Contraception.  Not sexually active. 4. Mammogram 08/2019.  Breast exam normal.  Continue with with annual mammogram. 5. Bladder leakage/incontinence.  Will check UA today.  Sounds like she has had chronic stress  urinary incontinence, no definite urge component.  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  I suggested doing the Kegel exercises regularly and timed voids.  If this continues to be a bothersome symptom then she should meet with urology to discuss next steps in management. 6. Health maintenance.  No labs today as she normally has these  completed with her primary care doctor.  In regards to her abdominal and low back pain, I have recommended decreasing her MiraLAX to just once daily as she is having loose and gassy stools, if no improvement then I think she should revisit with her primary doctor to look for further recommendations.  Return annually or sooner, prn.  Theresia Majors MD 12/02/19

## 2019-12-03 ENCOUNTER — Other Ambulatory Visit: Payer: Self-pay | Admitting: Family Medicine

## 2019-12-03 ENCOUNTER — Encounter: Payer: Self-pay | Admitting: Family Medicine

## 2019-12-03 DIAGNOSIS — K219 Gastro-esophageal reflux disease without esophagitis: Secondary | ICD-10-CM

## 2019-12-03 DIAGNOSIS — F418 Other specified anxiety disorders: Secondary | ICD-10-CM

## 2019-12-03 DIAGNOSIS — E7800 Pure hypercholesterolemia, unspecified: Secondary | ICD-10-CM

## 2019-12-03 LAB — PAP IG W/ RFLX HPV ASCU

## 2019-12-03 LAB — URINALYSIS, COMPLETE W/RFL CULTURE
Bacteria, UA: NONE SEEN /HPF
Bilirubin Urine: NEGATIVE
Glucose, UA: NEGATIVE
Hgb urine dipstick: NEGATIVE
Hyaline Cast: NONE SEEN /LPF
Ketones, ur: NEGATIVE
Leukocyte Esterase: NEGATIVE
Nitrites, Initial: NEGATIVE
Protein, ur: NEGATIVE
RBC / HPF: NONE SEEN /HPF (ref 0–2)
Specific Gravity, Urine: 1.025 (ref 1.001–1.03)
WBC, UA: NONE SEEN /HPF (ref 0–5)
pH: 5.5 (ref 5.0–8.0)

## 2019-12-03 LAB — NO CULTURE INDICATED

## 2019-12-03 NOTE — Telephone Encounter (Signed)
Refill for pending medication last OV 11/24/19. Please advise

## 2019-12-04 MED ORDER — SERTRALINE HCL 100 MG PO TABS: 200.0000 mg | ORAL_TABLET | Freq: Every day | ORAL | 1 refills | Status: DC

## 2019-12-04 MED ORDER — PANTOPRAZOLE SODIUM 40 MG PO TBEC: 40.0000 mg | DELAYED_RELEASE_TABLET | Freq: Every day | ORAL | 1 refills | Status: DC

## 2019-12-04 MED ORDER — BUSPIRONE HCL 15 MG PO TABS: 15.0000 mg | ORAL_TABLET | Freq: Two times a day (BID) | ORAL | 1 refills | Status: DC

## 2019-12-15 ENCOUNTER — Ambulatory Visit: Payer: Self-pay | Admitting: Obstetrics and Gynecology

## 2020-01-05 ENCOUNTER — Encounter: Payer: Self-pay | Admitting: Family Medicine

## 2020-03-25 ENCOUNTER — Other Ambulatory Visit: Payer: Self-pay | Admitting: Family Medicine

## 2020-03-25 DIAGNOSIS — E7800 Pure hypercholesterolemia, unspecified: Secondary | ICD-10-CM

## 2020-05-24 ENCOUNTER — Ambulatory Visit (INDEPENDENT_AMBULATORY_CARE_PROVIDER_SITE_OTHER): Payer: 59 | Admitting: Family Medicine

## 2020-05-24 ENCOUNTER — Encounter: Payer: Self-pay | Admitting: Family Medicine

## 2020-05-24 ENCOUNTER — Ambulatory Visit (INDEPENDENT_AMBULATORY_CARE_PROVIDER_SITE_OTHER): Payer: 59

## 2020-05-24 ENCOUNTER — Other Ambulatory Visit: Payer: Self-pay

## 2020-05-24 VITALS — BP 138/78 | HR 70 | Temp 97.6°F | Ht 63.0 in | Wt 184.4 lb

## 2020-05-24 DIAGNOSIS — Z0000 Encounter for general adult medical examination without abnormal findings: Secondary | ICD-10-CM

## 2020-05-24 DIAGNOSIS — M79604 Pain in right leg: Secondary | ICD-10-CM | POA: Diagnosis not present

## 2020-05-24 DIAGNOSIS — E611 Iron deficiency: Secondary | ICD-10-CM | POA: Diagnosis not present

## 2020-05-24 DIAGNOSIS — N393 Stress incontinence (female) (male): Secondary | ICD-10-CM

## 2020-05-24 LAB — CBC
HCT: 31.1 % — ABNORMAL LOW (ref 36.0–46.0)
Hemoglobin: 9.9 g/dL — ABNORMAL LOW (ref 12.0–15.0)
MCHC: 31.8 g/dL (ref 30.0–36.0)
MCV: 70.6 fl — ABNORMAL LOW (ref 78.0–100.0)
Platelets: 353 10*3/uL (ref 150.0–400.0)
RBC: 4.4 Mil/uL (ref 3.87–5.11)
RDW: 15.7 % — ABNORMAL HIGH (ref 11.5–15.5)
WBC: 6.4 10*3/uL (ref 4.0–10.5)

## 2020-05-24 LAB — BASIC METABOLIC PANEL
BUN: 15 mg/dL (ref 6–23)
CO2: 26 mEq/L (ref 19–32)
Calcium: 9.6 mg/dL (ref 8.4–10.5)
Chloride: 105 mEq/L (ref 96–112)
Creatinine, Ser: 0.78 mg/dL (ref 0.40–1.20)
GFR: 89.41 mL/min (ref >60.00)
Glucose, Bld: 121 mg/dL — ABNORMAL HIGH (ref 70–99)
Potassium: 4.1 mEq/L (ref 3.5–5.1)
Sodium: 138 mEq/L (ref 135–145)

## 2020-05-24 NOTE — Progress Notes (Signed)
Established Patient Office Visit  Subjective:  Patient ID: Jean Vazquez, female    DOB: [birth year]  Age: 50 y.o. MRN: [id number]  CC:  Chief Complaint  Patient presents with  . Follow-up    6 month follow up, patient would like referral to urologist for urine incontinence, gastrologist for colonoscopy. Pain in bottom of right leg patient would like this checked.     HPI Jean Vazquez presents for follow-up of depression, right lateral leg pain, iron deficiency anemia, stress incontinence, and health maintenance.  Continues high-dose sertraline and BuSpar for anxiety with depression.  She is stressed.  She worries about her husband and her kids.  She is gaining weight and she is aware that it is from stress eating.  Ongoing history of right lateral leg pain.  She had developed this about 4 years ago after running on a treadmill she is stopped running and it improved after she started walking for exercise.  But months that she has a persistent lateral leg pain that is actually woken her up.  Continues iron daily for iron deficiency anemia.  She is having no trouble taking 1 pill daily.  Colace helps to keep her stools soft.  Stress incontinence is slowly worsening.  She has tried Kegel exercises.  Past Medical History:  Diagnosis Date  . Acid reflux   . Anemia   . Anxiety   . Depression   . Urinary incontinence     History reviewed. No pertinent surgical history.  Family History  Problem Relation Age of Onset  . Heart disease Mother   . Diabetes Mother   . Cancer Father        Lung  . Anxiety disorder Father   . Diabetes Father   . Diabetes type I Daughter   . Breast cancer Maternal Aunt     Social History   Socioeconomic History  . Marital status: Married    Spouse name: Not on file  . Number of children: Not on file  . Years of education: Not on file  . Highest education level: Not on file  Occupational History  . Not on file  Tobacco Use  . Smoking status:  Former Games developer  . Smokeless tobacco: Never Used  Vaping Use  . Vaping Use: Not on file  Substance and Sexual Activity  . Alcohol use: No  . Drug use: No  . Sexual activity: Not Currently    Comment: 1st intercourse 50yo-Fewer than 5 partners  Other Topics Concern  . Not on file  Social History Narrative  . Not on file   Social Determinants of Health   Financial Resource Strain: Not on file  Food Insecurity: Not on file  Transportation Needs: Not on file  Physical Activity: Not on file  Stress: Not on file  Social Connections: Not on file  Intimate Partner Violence: Not on file    Outpatient Medications Prior to Visit  Medication Sig Dispense Refill  . aspirin 81 MG chewable tablet Chew 1 tablet (81 mg total) by mouth daily. 90 tablet 1  . atorvastatin (LIPITOR) 40 MG tablet Take 1 tablet by mouth once daily 90 tablet 0  . busPIRone (BUSPAR) 15 MG tablet Take 1 tablet (15 mg total) by mouth 2 (two) times daily. 180 tablet 1  . docusate sodium (COLACE) 100 MG capsule Take 1 capsule (100 mg total) by mouth 2 (two) times daily. With iron tablets 10 capsule 0  . iron polysaccharides (NIFEREX) 150 MG capsule Take 1 capsule (  150 mg total) by mouth daily. 30 capsule 5  . pantoprazole (PROTONIX) 40 MG tablet Take 1 tablet (40 mg total) by mouth daily. 90 tablet 1  . polyethylene glycol powder (GLYCOLAX/MIRALAX) 17 GM/SCOOP powder Take 17 g by mouth 2 (two) times daily as needed. 3350 g 1  . sertraline (ZOLOFT) 100 MG tablet Take 2 tablets (200 mg total) by mouth daily. 180 tablet 1   No facility-administered medications prior to visit.    No Known Allergies  ROS Review of Systems  Constitutional: Negative.   HENT: Negative.   Eyes: Negative for photophobia and visual disturbance.  Respiratory: Negative.   Cardiovascular: Negative.   Gastrointestinal: Negative.   Endocrine: Negative for polyphagia and polyuria.  Genitourinary: Negative.   Musculoskeletal: Negative for gait  problem.  Allergic/Immunologic: Negative for immunocompromised state.  Neurological: Negative for speech difficulty and weakness.  Psychiatric/Behavioral: Positive for dysphoric mood. The patient is nervous/anxious.       Objective:    Physical Exam Nursing note reviewed.  Constitutional:      General: She is not in acute distress.    Appearance: Normal appearance. She is not ill-appearing, toxic-appearing or diaphoretic.  HENT:     Head: Normocephalic and atraumatic.     Right Ear: Tympanic membrane, ear canal and external ear normal.     Left Ear: Tympanic membrane, ear canal and external ear normal.     Mouth/Throat:     Mouth: Mucous membranes are moist.     Pharynx: Oropharynx is clear. No oropharyngeal exudate or posterior oropharyngeal erythema.  Eyes:     General: No scleral icterus.       Right eye: No discharge.        Left eye: No discharge.     Extraocular Movements: Extraocular movements intact.     Conjunctiva/sclera: Conjunctivae normal.     Pupils: Pupils are equal, round, and reactive to light.  Neck:     Vascular: No carotid bruit.  Cardiovascular:     Rate and Rhythm: Normal rate and regular rhythm.  Pulmonary:     Effort: Pulmonary effort is normal.     Breath sounds: Normal breath sounds.  Abdominal:     General: Bowel sounds are normal.  Musculoskeletal:     Cervical back: No rigidity or tenderness.     Right lower leg: Tenderness and bony tenderness present. No swelling, deformity or lacerations. No edema.     Left lower leg: No edema.     Right ankle: No swelling or deformity. No tenderness. Normal range of motion.     Right Achilles Tendon: Normal.       Legs:  Lymphadenopathy:     Cervical: No cervical adenopathy.  Skin:    General: Skin is warm and dry.  Neurological:     Mental Status: She is alert and oriented to person, place, and time.  Psychiatric:        Mood and Affect: Mood normal.        Behavior: Behavior normal.     BP  138/78   Pulse 70   Temp 97.6 F (36.4 C) (Temporal)   Ht 5\' 3"  (1.6 m)   Wt 184 lb 6.4 oz (83.6 kg)   SpO2 97%   BMI 32.66 kg/m  Wt Readings from Last 3 Encounters:  05/24/20 184 lb 6.4 oz (83.6 kg)  12/02/19 177 lb (80.3 kg)  11/24/19 177 lb 6.4 oz (80.5 kg)     Health Maintenance Due  Topic Date  Due  . Hepatitis C Screening  Never done  . HIV Screening  Never done  . PAP SMEAR-Modifier  Never done  . COLONOSCOPY (Pts 45-71yrs Insurance coverage will need to be confirmed)  Never done    There are no preventive care reminders to display for this patient.  Lab Results  Component Value Date   TSH 0.93 05/21/2019   Lab Results  Component Value Date   WBC 7.9 11/24/2019   HGB 13.0 11/24/2019   HCT 40.0 11/24/2019   MCV 83.7 11/24/2019   PLT 292.0 11/24/2019   Lab Results  Component Value Date   NA 139 05/21/2019   K 4.8 05/21/2019   CO2 27 05/21/2019   GLUCOSE 102 (H) 05/21/2019   BUN 14 05/21/2019   CREATININE 0.86 05/21/2019   BILITOT 0.4 05/21/2019   ALKPHOS 58 05/21/2019   AST 15 05/21/2019   ALT 17 05/21/2019   PROT 6.5 05/21/2019   ALBUMIN 4.1 05/21/2019   CALCIUM 9.5 05/21/2019   ANIONGAP 7 09/23/2014   GFR 70.41 05/21/2019   Lab Results  Component Value Date   CHOL 144 05/21/2019   Lab Results  Component Value Date   HDL 49.10 05/21/2019   Lab Results  Component Value Date   LDLCALC 68 05/21/2019   Lab Results  Component Value Date   TRIG 132.0 05/21/2019   Lab Results  Component Value Date   CHOLHDL 3 05/21/2019   No results found for: HGBA1C    Assessment & Plan:   Problem List Items Addressed This Visit      Other   Healthcare maintenance   Relevant Orders   Ambulatory referral to Gastroenterology   Basic metabolic panel   MM Digital Screening   Iron deficiency - Primary   Relevant Orders   CBC   Iron, TIBC and Ferritin Panel   Stress incontinence   Relevant Orders   Ambulatory referral to Urology   Leg pain,  lateral, right   Relevant Orders   Ambulatory referral to Sports Medicine      No orders of the defined types were placed in this encounter.   Follow-up: Return in about 3 months (around 08/24/2020), or if symptoms worsen or fail to improve.    Mliss Sax, MD

## 2020-05-24 NOTE — Addendum Note (Signed)
Addended by: Varney Biles on: 05/24/2020 11:26 AM   Modules accepted: Orders

## 2020-05-24 NOTE — Addendum Note (Signed)
Addended by: Varney Biles on: 05/24/2020 11:25 AM   Modules accepted: Orders

## 2020-05-25 LAB — IRON,TIBC AND FERRITIN PANEL
%SAT: 7 % (calc) — ABNORMAL LOW (ref 16–45)
Ferritin: 4 ng/mL — ABNORMAL LOW (ref 16–232)
Iron: 30 ug/dL — ABNORMAL LOW (ref 40–190)
TIBC: 447 mcg/dL (calc) (ref 250–450)

## 2020-05-26 ENCOUNTER — Encounter: Payer: Self-pay | Admitting: Family Medicine

## 2020-05-31 ENCOUNTER — Other Ambulatory Visit: Payer: Self-pay

## 2020-05-31 ENCOUNTER — Ambulatory Visit (INDEPENDENT_AMBULATORY_CARE_PROVIDER_SITE_OTHER): Payer: 59 | Admitting: Sports Medicine

## 2020-05-31 DIAGNOSIS — M7661 Achilles tendinitis, right leg: Secondary | ICD-10-CM | POA: Diagnosis not present

## 2020-05-31 DIAGNOSIS — M7671 Peroneal tendinitis, right leg: Secondary | ICD-10-CM | POA: Insufficient documentation

## 2020-05-31 MED ORDER — MELOXICAM 15 MG PO TABS: ORAL_TABLET | ORAL | 0 refills | Status: DC

## 2020-05-31 NOTE — Assessment & Plan Note (Signed)
Patient with symptoms and U/S findings consistent with tenosynovitis.  - HEP - Meloxicam - F/u in 3 weeks

## 2020-05-31 NOTE — Progress Notes (Signed)
    SUBJECTIVE:   CHIEF COMPLAINT / HPI:   Left Ankle/Heel Pain Jean Vazquez is a very pleasant 50 y/o female who presents today for several weeks of right heel and ankle pain. She describes it as progressively getting worse, now becoming constant. Pain is in the back of her heel and on the lateral side of her ankle. No trauma or inciting event. She did seek care elsewhere and had x-rays which she was told were normal. Pain is worse with walking. Denies any popping or snapping sensation. No swelling or bruising that she could appreciate.  PERTINENT  PMH / PSH: HLD, Depression  OBJECTIVE:   BP 128/84   Ht 5\' 3"  (1.6 m)   Wt 185 lb (83.9 kg)   BMI 32.77 kg/m   No flowsheet data found.  Ankle/Foot, Right: No visible erythema, swelling, ecchymosis, or bony deformity. No notable pes planus deformity. Transverse arch grossly intact; Range of motion is full in all directions. Strength is 5/5 in all directions.  Tenderness at the Achilles tendon to palpation just proximal to the insertion; Mild peroneal tendon tenderness. Unremarkable squeeze and kleiger tests; Negative talar til, negative anterior drawer test.  Able to walk 4 steps.   Limited MSK U/S: Right ankle Significant for mild hypoechoic fluid collection around the peroneus tendons. Significant for enlargement of the achilles tendon midsubstance. No evidence of fiber disruption or complete tearing. Impression: Achilles tendonitis and mild peroneus tenosynovitis.   ASSESSMENT/PLAN:   Achilles tendinitis of right lower extremity Patient with exam, history, and U/S findings all consistent with achilles tendonitis.  - Heel lifts - Meloxicam 15mg  once daily with food - Body Helix ankle compression sleeve - HEP - F/u in 3 weeks  Tendonitis of right peroneus longus tendon Patient with symptoms and U/S findings consistent with tenosynovitis.  - HEP - Meloxicam - F/u in 3 weeks     , DO PGY-4, Sports Medicine  Fellow [facility]  Patient seen and evaluated with the sports medicine fellow.  I agree with the above plan of care.  I saw this patient for Achilles tendinopathy about a year ago.  She improved with treatment at that time including a brief period of immobilization and home exercise program.  Treatment as above.  I do not think she needs to immobilize her ankle since she is not limping but I did ask her to wear her cam walker for about a week if she does not notice immediate improvement with our treatment today including oral NSAIDs.  Follow-up with me in 3 weeks for reevaluation.  Call with questions or concerns in the interim.

## 2020-05-31 NOTE — Assessment & Plan Note (Signed)
Patient with exam, history, and U/S findings all consistent with achilles tendonitis.  - Heel lifts - Meloxicam 15mg  once daily with food - Body Helix ankle compression sleeve - HEP - F/u in 3 weeks

## 2020-06-21 ENCOUNTER — Ambulatory Visit: Payer: 59 | Admitting: Sports Medicine

## 2020-06-28 ENCOUNTER — Ambulatory Visit: Payer: 59 | Admitting: Sports Medicine

## 2020-06-29 ENCOUNTER — Other Ambulatory Visit: Payer: Self-pay | Admitting: Family Medicine

## 2020-06-29 DIAGNOSIS — E7800 Pure hypercholesterolemia, unspecified: Secondary | ICD-10-CM

## 2020-06-29 DIAGNOSIS — K219 Gastro-esophageal reflux disease without esophagitis: Secondary | ICD-10-CM

## 2020-06-29 DIAGNOSIS — F418 Other specified anxiety disorders: Secondary | ICD-10-CM

## 2020-08-25 ENCOUNTER — Other Ambulatory Visit: Payer: Self-pay

## 2020-08-25 ENCOUNTER — Encounter: Payer: Self-pay | Admitting: Family Medicine

## 2020-08-25 ENCOUNTER — Ambulatory Visit (INDEPENDENT_AMBULATORY_CARE_PROVIDER_SITE_OTHER): Payer: 59 | Admitting: Family Medicine

## 2020-08-25 VITALS — BP 128/80 | HR 80 | Temp 98.0°F | Ht 63.0 in | Wt 183.0 lb

## 2020-08-25 DIAGNOSIS — E7800 Pure hypercholesterolemia, unspecified: Secondary | ICD-10-CM

## 2020-08-25 DIAGNOSIS — F418 Other specified anxiety disorders: Secondary | ICD-10-CM

## 2020-08-25 DIAGNOSIS — E611 Iron deficiency: Secondary | ICD-10-CM | POA: Diagnosis not present

## 2020-08-25 DIAGNOSIS — D509 Iron deficiency anemia, unspecified: Secondary | ICD-10-CM

## 2020-08-25 DIAGNOSIS — R7309 Other abnormal glucose: Secondary | ICD-10-CM | POA: Insufficient documentation

## 2020-08-25 LAB — CBC
HCT: 34.8 % — ABNORMAL LOW (ref 36.0–46.0)
Hemoglobin: 11.2 g/dL — ABNORMAL LOW (ref 12.0–15.0)
MCHC: 32.1 g/dL (ref 30.0–36.0)
MCV: 71.2 fl — ABNORMAL LOW (ref 78.0–100.0)
Platelets: 288 10*3/uL (ref 150.0–400.0)
RBC: 4.89 Mil/uL (ref 3.87–5.11)
RDW: 18.9 % — ABNORMAL HIGH (ref 11.5–15.5)
WBC: 5.2 10*3/uL (ref 4.0–10.5)

## 2020-08-25 LAB — URINALYSIS, ROUTINE W REFLEX MICROSCOPIC
Bilirubin Urine: NEGATIVE
Ketones, ur: NEGATIVE
Leukocytes,Ua: NEGATIVE
Nitrite: NEGATIVE
Specific Gravity, Urine: <=1.005 — AB (ref 1.000–1.030)
Total Protein, Urine: NEGATIVE
Urine Glucose: NEGATIVE
Urobilinogen, UA: 0.2 (ref 0.0–1.0)
pH: 6 (ref 5.0–8.0)

## 2020-08-25 LAB — COMPREHENSIVE METABOLIC PANEL
ALT: 14 U/L (ref 0–35)
AST: 13 U/L (ref 0–37)
Albumin: 4.1 g/dL (ref 3.5–5.2)
Alkaline Phosphatase: 60 U/L (ref 39–117)
BUN: 14 mg/dL (ref 6–23)
CO2: 25 mEq/L (ref 19–32)
Calcium: 9.7 mg/dL (ref 8.4–10.5)
Chloride: 107 mEq/L (ref 96–112)
Creatinine, Ser: 0.79 mg/dL (ref 0.40–1.20)
GFR: 87.9 mL/min (ref >60.00)
Glucose, Bld: 96 mg/dL (ref 70–99)
Potassium: 4.5 mEq/L (ref 3.5–5.1)
Sodium: 138 mEq/L (ref 135–145)
Total Bilirubin: 0.3 mg/dL (ref 0.2–1.2)
Total Protein: 6.7 g/dL (ref 6.0–8.3)

## 2020-08-25 LAB — HEMOGLOBIN A1C: Hgb A1c MFr Bld: 6.1 % (ref 4.6–6.5)

## 2020-08-25 NOTE — Progress Notes (Addendum)
Established Patient Office Visit  Subjective:  Patient ID: Jean Vazquez, female    DOB: August 09, 1970  Age: 50 y.o. MRN: [id number]  CC:  Chief Complaint  Patient presents with   Follow-up    Follow up on leg pains and iron levels. No concerns.     HPI Jean Vazquez presents for follow-up of microcytic anemia with iron deficiency, elevated glucose, urinary incontinence and depression with anxiety.  Anxiety and depression are stable with sertraline and buspirone.  She is currently stressed with her daughter's upcoming wedding on Saturday.  She has been able to take iron twice daily but often forgets the midday pill.  She is dealing with constipation.  Has not heard from GI about a screening colonoscopy.  Recent Pap smear per GYN provider.  She is currently in physical therapy with pelvic floor exercises for urinary Incontinence.  Weight loss has been difficult for her because of her recent stress.  Past Medical History:  Diagnosis Date   Acid reflux    Anemia    Anxiety    Depression    Urinary incontinence     History reviewed. No pertinent surgical history.  Family History  Problem Relation Age of Onset   Heart disease Mother    Diabetes Mother    Cancer Father        Lung   Anxiety disorder Father    Diabetes Father    Diabetes type I Daughter    Breast cancer Maternal Aunt     Social History   Socioeconomic History   Marital status: Married    Spouse name: Not on file   Number of children: Not on file   Years of education: Not on file   Highest education level: Not on file  Occupational History   Not on file  Tobacco Use   Smoking status: Former    Pack years: 0.00   Smokeless tobacco: Never  Vaping Use   Vaping Use: Not on file  Substance and Sexual Activity   Alcohol use: No   Drug use: No   Sexual activity: Not Currently    Comment: 1st intercourse 50yo-Fewer than 5 partners  Other Topics Concern   Not on file  Social History Narrative   Not  on file   Social Determinants of Health   Financial Resource Strain: Not on file  Food Insecurity: Not on file  Transportation Needs: Not on file  Physical Activity: Not on file  Stress: Not on file  Social Connections: Not on file  Intimate Partner Violence: Not on file    Outpatient Medications Prior to Visit  Medication Sig Dispense Refill   aspirin 81 MG chewable tablet Chew 1 tablet (81 mg total) by mouth daily. 90 tablet 1   atorvastatin (LIPITOR) 40 MG tablet Take 1 tablet by mouth once daily 90 tablet 0   busPIRone (BUSPAR) 15 MG tablet Take 1 tablet (15 mg total) by mouth 2 (two) times daily. 180 tablet 1   docusate sodium (COLACE) 100 MG capsule Take 1 capsule (100 mg total) by mouth 2 (two) times daily. With iron tablets 10 capsule 0   iron polysaccharides (NIFEREX) 150 MG capsule Take 1 capsule (150 mg total) by mouth daily. 30 capsule 5   pantoprazole (PROTONIX) 40 MG tablet Take 1 tablet by mouth once daily 90 tablet 0   polyethylene glycol powder (GLYCOLAX/MIRALAX) 17 GM/SCOOP powder Take 17 g by mouth 2 (two) times daily as needed. 3350 g 1   sertraline (ZOLOFT)  100 MG tablet Take 2 tablets by mouth once daily 180 tablet 0   meloxicam (MOBIC) 15 MG tablet Take 1 tablet daily with food for 7 days. Then take as needed. (Patient not taking: Reported on 08/25/2020) 40 tablet 0   No facility-administered medications prior to visit.    No Known Allergies  ROS Review of Systems  Constitutional: Negative.   HENT: Negative.    Eyes:  Negative for photophobia and visual disturbance.  Respiratory: Negative.    Cardiovascular: Negative.   Gastrointestinal: Negative.  Negative for anal bleeding and blood in stool.  Endocrine: Negative for polyphagia and polyuria.  Genitourinary: Negative.   Musculoskeletal:  Negative for gait problem.  Neurological:  Negative for speech difficulty and weakness.  Depression screen [facility] 2/9 08/25/2020 05/24/2020 05/24/2020  Decreased Interest 0  0 0  Down, Depressed, Hopeless 0 0 0  PHQ - 2 Score 0 0 0  Altered sleeping - 1 -  Tired, decreased energy - 1 -  Change in appetite - 3 -  Feeling bad or failure about yourself  - 0 -  Trouble concentrating - 1 -  Moving slowly or fidgety/restless - 1 -  Suicidal thoughts - 0 -  PHQ-9 Score - 7 -  Difficult doing work/chores - Somewhat difficult -       Objective:    Physical Exam Vitals and nursing note reviewed.  Constitutional:      General: She is not in acute distress.    Appearance: Normal appearance. She is not ill-appearing, toxic-appearing or diaphoretic.  HENT:     Head: Normocephalic and atraumatic.     Right Ear: Tympanic membrane, ear canal and external ear normal.     Left Ear: Tympanic membrane, ear canal and external ear normal.     Mouth/Throat:     Mouth: Mucous membranes are moist.     Pharynx: Oropharynx is clear. No oropharyngeal exudate or posterior oropharyngeal erythema.  Eyes:     General: No scleral icterus.       Right eye: No discharge.        Left eye: No discharge.     Extraocular Movements: Extraocular movements intact.     Conjunctiva/sclera: Conjunctivae normal.     Pupils: Pupils are equal, round, and reactive to light.  Neck:     Vascular: No carotid bruit.  Cardiovascular:     Rate and Rhythm: Normal rate and regular rhythm.  Pulmonary:     Effort: Pulmonary effort is normal.     Breath sounds: Normal breath sounds.  Musculoskeletal:     Cervical back: No rigidity or tenderness.  Lymphadenopathy:     Cervical: No cervical adenopathy.  Skin:    General: Skin is warm and dry.  Neurological:     Mental Status: She is alert and oriented to person, place, and time.    BP 128/80   Pulse 80   Temp 98 F (36.7 C) (Temporal)   Ht 5\' 3"  (1.6 m)   Wt 183 lb (83 kg)   SpO2 97%   BMI 32.42 kg/m  Wt Readings from Last 3 Encounters:  08/25/20 183 lb (83 kg)  05/31/20 185 lb (83.9 kg)  05/24/20 184 lb 6.4 oz (83.6 kg)      Health Maintenance Due  Topic Date Due   HIV Screening  Never done   Hepatitis C Screening  Never done   PAP SMEAR-Modifier  Never done   COLONOSCOPY (Pts 45-49yrs Insurance coverage will need to be  confirmed)  Never done    There are no preventive care reminders to display for this patient.  Lab Results  Component Value Date   TSH 0.93 05/21/2019   Lab Results  Component Value Date   WBC 5.2 08/25/2020   HGB 11.2 (L) 08/25/2020   HCT 34.8 (L) 08/25/2020   MCV 71.2 (L) 08/25/2020   PLT 288.0 08/25/2020   Lab Results  Component Value Date   NA 138 08/25/2020   K 4.5 08/25/2020   CO2 25 08/25/2020   GLUCOSE 96 08/25/2020   BUN 14 08/25/2020   CREATININE 0.79 08/25/2020   BILITOT 0.3 08/25/2020   ALKPHOS 60 08/25/2020   AST 13 08/25/2020   ALT 14 08/25/2020   PROT 6.7 08/25/2020   ALBUMIN 4.1 08/25/2020   CALCIUM 9.7 08/25/2020   ANIONGAP 7 09/23/2014   GFR 87.90 08/25/2020   Lab Results  Component Value Date   CHOL 144 05/21/2019   Lab Results  Component Value Date   HDL 49.10 05/21/2019   Lab Results  Component Value Date   LDLCALC 68 05/21/2019   Lab Results  Component Value Date   TRIG 132.0 05/21/2019   Lab Results  Component Value Date   CHOLHDL 3 05/21/2019   Lab Results  Component Value Date   HGBA1C 6.1 08/25/2020      Assessment & Plan:   Problem List Items Addressed This Visit       Other   Iron deficiency - Primary   Relevant Orders   CBC (Completed)   Iron, TIBC and Ferritin Panel (Completed)   Depression with anxiety   Elevated cholesterol   Relevant Orders   LDL cholesterol, direct   Microcytic anemia   Relevant Orders   CBC (Completed)   Iron, TIBC and Ferritin Panel (Completed)   Ambulatory referral to Gastroenterology   Elevated glucose   Relevant Orders   Comprehensive metabolic panel (Completed)   Hemoglobin A1c (Completed)   Urinalysis, Routine w reflex microscopic (Completed)    No orders of the  defined types were placed in this encounter.   Follow-up: Return in about 3 months (around 11/25/2020).  Continue Zoloft and BuSpar.  Continue iron therapy.  Continue atorvastatin for elevated cholesterol.  He was given information on iron deficiency anemia.  Mliss Sax, MD

## 2020-08-26 LAB — IRON,TIBC AND FERRITIN PANEL
%SAT: 9 % (calc) — ABNORMAL LOW (ref 16–45)
Ferritin: 5 ng/mL — ABNORMAL LOW (ref 16–232)
Iron: 35 ug/dL — ABNORMAL LOW (ref 40–190)
TIBC: 402 mcg/dL (calc) (ref 250–450)

## 2020-08-26 NOTE — Addendum Note (Signed)
Addended by: Andrez Grime on: 08/26/2020 07:39 AM   Modules accepted: Orders

## 2020-09-27 ENCOUNTER — Other Ambulatory Visit: Payer: Self-pay | Admitting: Family Medicine

## 2020-09-27 DIAGNOSIS — K219 Gastro-esophageal reflux disease without esophagitis: Secondary | ICD-10-CM

## 2020-09-27 DIAGNOSIS — E7800 Pure hypercholesterolemia, unspecified: Secondary | ICD-10-CM

## 2020-09-27 DIAGNOSIS — F418 Other specified anxiety disorders: Secondary | ICD-10-CM

## 2020-09-29 LAB — HM MAMMOGRAPHY

## 2020-10-04 ENCOUNTER — Other Ambulatory Visit: Payer: Self-pay

## 2020-10-04 ENCOUNTER — Ambulatory Visit (INDEPENDENT_AMBULATORY_CARE_PROVIDER_SITE_OTHER): Payer: 59 | Admitting: Sports Medicine

## 2020-10-04 VITALS — Ht 63.0 in | Wt 183.0 lb

## 2020-10-04 DIAGNOSIS — M7711 Lateral epicondylitis, right elbow: Secondary | ICD-10-CM | POA: Diagnosis not present

## 2020-10-04 MED ORDER — MELOXICAM 15 MG PO TABS: ORAL_TABLET | ORAL | 0 refills | Status: DC

## 2020-10-04 NOTE — Progress Notes (Signed)
   Subjective:    Patient ID: Jean Vazquez, female    DOB: August 06, 1970, 50 y.o.   MRN: [id number]  HPI chief complaint: Right elbow pain  Jean Vazquez comes in today complaining of 2 weeks of lateral right elbow pain.  Pain began after she was moving a lot of heavy objects.  No known trauma.  She has pain at rest as well as with direct pressure over the area.  Denies any prior occurrences.  Interim medical history reviewed Medications reviewed Allergies reviewed    Review of Systems As above    Objective:   Physical Exam  Well-developed, well-nourished.  No acute distress  Right elbow: Full range of motion.  No effusion.  No soft tissue swelling.  She is tender to palpation directly over the lateral epicondyle with reproducible pain with resisted ECRB testing.  Good pulses distally.      Assessment & Plan:   Right elbow pain secondary to lateral epicondylitis  Meloxicam 15 mg daily for 7 days then as needed.  She is cautioned about GI upset and will stop the medicine immediately if she experiences this.  She will also use a cock up wrist brace during the day to help rest the common extensor muscle wad.  She will ice daily.  Hopefully symptoms will resolve quickly over the next 2 weeks.  If not, consider a short course of oral steroids and a home exercise program.  Follow-up for ongoing or recalcitrant issues.

## 2020-11-24 ENCOUNTER — Other Ambulatory Visit: Payer: Self-pay

## 2020-11-25 ENCOUNTER — Ambulatory Visit (INDEPENDENT_AMBULATORY_CARE_PROVIDER_SITE_OTHER): Payer: 59 | Admitting: Family Medicine

## 2020-11-25 ENCOUNTER — Encounter: Payer: Self-pay | Admitting: Family Medicine

## 2020-11-25 VITALS — BP 122/80 | HR 70 | Temp 97.8°F | Ht 63.0 in | Wt 181.4 lb

## 2020-11-25 DIAGNOSIS — F418 Other specified anxiety disorders: Secondary | ICD-10-CM | POA: Diagnosis not present

## 2020-11-25 DIAGNOSIS — R319 Hematuria, unspecified: Secondary | ICD-10-CM | POA: Diagnosis not present

## 2020-11-25 DIAGNOSIS — Z23 Encounter for immunization: Secondary | ICD-10-CM | POA: Diagnosis not present

## 2020-11-25 DIAGNOSIS — E7800 Pure hypercholesterolemia, unspecified: Secondary | ICD-10-CM

## 2020-11-25 DIAGNOSIS — E611 Iron deficiency: Secondary | ICD-10-CM

## 2020-11-25 DIAGNOSIS — R7309 Other abnormal glucose: Secondary | ICD-10-CM

## 2020-11-25 LAB — URINALYSIS, ROUTINE W REFLEX MICROSCOPIC
Bilirubin Urine: NEGATIVE
Hgb urine dipstick: NEGATIVE
Ketones, ur: NEGATIVE
Leukocytes,Ua: NEGATIVE
Nitrite: NEGATIVE
Specific Gravity, Urine: <=1.005 — AB (ref 1.000–1.030)
Total Protein, Urine: NEGATIVE
Urine Glucose: NEGATIVE
Urobilinogen, UA: 0.2 (ref 0.0–1.0)
WBC, UA: NONE SEEN — AB (ref 0–?)
pH: 7 (ref 5.0–8.0)

## 2020-11-25 LAB — CBC
HCT: 39.5 % (ref 36.0–46.0)
Hemoglobin: 12.6 g/dL (ref 12.0–15.0)
MCHC: 31.9 g/dL (ref 30.0–36.0)
MCV: 80 fl (ref 78.0–100.0)
Platelets: 258 10*3/uL (ref 150.0–400.0)
RBC: 4.94 Mil/uL (ref 3.87–5.11)
RDW: 18 % — ABNORMAL HIGH (ref 11.5–15.5)
WBC: 5.5 10*3/uL (ref 4.0–10.5)

## 2020-11-25 LAB — BASIC METABOLIC PANEL
BUN: 16 mg/dL (ref 6–23)
CO2: 24 mEq/L (ref 19–32)
Calcium: 9.8 mg/dL (ref 8.4–10.5)
Chloride: 105 mEq/L (ref 96–112)
Creatinine, Ser: 0.91 mg/dL (ref 0.40–1.20)
GFR: 74.05 mL/min (ref >60.00)
Glucose, Bld: 92 mg/dL (ref 70–99)
Potassium: 4.5 mEq/L (ref 3.5–5.1)
Sodium: 138 mEq/L (ref 135–145)

## 2020-11-25 LAB — LDL CHOLESTEROL, DIRECT: Direct LDL: 75 mg/dL

## 2020-11-25 LAB — FOLLICLE STIMULATING HORMONE: FSH: 4.3 m[IU]/mL

## 2020-11-25 NOTE — Progress Notes (Signed)
Established Patient Office Visit  Subjective:  Patient ID: Jean Vazquez, female    DOB: 1970-05-20  Age: 50 y.o. MRN: [id number]  CC:  Chief Complaint  Patient presents with   Follow-up    3 month f/u, RT elbow pain x 8 weeks.  Has been seeing sports medicine.   Want flu shot today.     HPI Edit Ricciardelli presents for follow-up iron deficiency, hematuria, depression with anxiety, right elbow pain, elevated ldl cholesterol and the flu vaccine.  Continues taking atorvastatin without issue.  Requesting a flu vaccine today.  History of lateral epicondylitis in the right arm.  Frustrated with the length of time it is taking to heal.  Heide Spark was a success.  She is under less stress and continues with sertraline and BuSpar for anxiety and depression.  She has been taking her iron pills twice daily.  She was on her menstrual flow when here last.  UA at that time had shown hematuria.  Last pelvic exam was about a year ago with Dr. Lin Givens.  Past Medical History:  Diagnosis Date   Acid reflux    Anemia    Anxiety    Depression    Urinary incontinence     No past surgical history on file.  Family History  Problem Relation Age of Onset   Heart disease Mother    Diabetes Mother    Cancer Father        Lung   Anxiety disorder Father    Diabetes Father    Diabetes type I Daughter    Breast cancer Maternal Aunt     Social History   Socioeconomic History   Marital status: Married    Spouse name: Not on file   Number of children: Not on file   Years of education: Not on file   Highest education level: Not on file  Occupational History   Not on file  Tobacco Use   Smoking status: Former   Smokeless tobacco: Never  Vaping Use   Vaping Use: Not on file  Substance and Sexual Activity   Alcohol use: No   Drug use: No   Sexual activity: Not Currently    Comment: 1st intercourse 50yo-Fewer than 5 partners  Other Topics Concern   Not on file  Social History Narrative    Not on file   Social Determinants of Health   Financial Resource Strain: Not on file  Food Insecurity: Not on file  Transportation Needs: Not on file  Physical Activity: Not on file  Stress: Not on file  Social Connections: Not on file  Intimate Partner Violence: Not on file    Outpatient Medications Prior to Visit  Medication Sig Dispense Refill   aspirin 81 MG chewable tablet Chew 1 tablet (81 mg total) by mouth daily. 90 tablet 1   atorvastatin (LIPITOR) 40 MG tablet Take 1 tablet by mouth once daily 90 tablet 0   busPIRone (BUSPAR) 15 MG tablet Take 1 tablet by mouth twice daily 180 tablet 0   docusate sodium (COLACE) 100 MG capsule Take 1 capsule (100 mg total) by mouth 2 (two) times daily. With iron tablets 10 capsule 0   iron polysaccharides (NIFEREX) 150 MG capsule Take 1 capsule (150 mg total) by mouth daily. 30 capsule 5   pantoprazole (PROTONIX) 40 MG tablet Take 1 tablet by mouth once daily 90 tablet 0   polyethylene glycol powder (GLYCOLAX/MIRALAX) 17 GM/SCOOP powder Take 17 g by mouth 2 (two) times daily as  needed. 3350 g 1   sertraline (ZOLOFT) 100 MG tablet Take 2 tablets by mouth once daily 180 tablet 0   meloxicam (MOBIC) 15 MG tablet Take 1 tablet daily with food for 7 days. Then take as needed. (Patient not taking: Reported on 11/25/2020) 30 tablet 0   No facility-administered medications prior to visit.    No Known Allergies  ROS Review of Systems  Constitutional: Negative.   HENT: Negative.    Eyes:  Negative for photophobia and visual disturbance.  Respiratory: Negative.    Cardiovascular: Negative.   Gastrointestinal: Negative.   Genitourinary: Negative.  Negative for hematuria.  Musculoskeletal:  Positive for arthralgias.  Skin:  Negative for pallor and rash.  Neurological:  Negative for speech difficulty and weakness.  Psychiatric/Behavioral:  Positive for dysphoric mood.      Objective:    Physical Exam Vitals and nursing note reviewed.   Constitutional:      Appearance: Normal appearance.  HENT:     Head: Normocephalic and atraumatic.     Right Ear: Tympanic membrane, ear canal and external ear normal.     Left Ear: Tympanic membrane, ear canal and external ear normal.     Mouth/Throat:     Mouth: Mucous membranes are moist.     Pharynx: Oropharynx is clear. No oropharyngeal exudate or posterior oropharyngeal erythema.  Eyes:     General: No scleral icterus.       Right eye: No discharge.        Left eye: No discharge.     Extraocular Movements: Extraocular movements intact.     Conjunctiva/sclera: Conjunctivae normal.     Pupils: Pupils are equal, round, and reactive to light.  Cardiovascular:     Rate and Rhythm: Normal rate and regular rhythm.  Pulmonary:     Effort: Pulmonary effort is normal.     Breath sounds: Normal breath sounds.  Musculoskeletal:     Right elbow: Normal range of motion. Tenderness present in lateral epicondyle.     Cervical back: No rigidity or tenderness.  Lymphadenopathy:     Cervical: No cervical adenopathy.  Skin:    General: Skin is warm and dry.  Neurological:     Mental Status: She is alert and oriented to person, place, and time.  Psychiatric:        Mood and Affect: Mood normal.        Behavior: Behavior normal.    BP 122/80   Pulse 70   Temp 97.8 F (36.6 C) (Temporal)   Ht 5\' 3"  (1.6 m)   Wt 181 lb 6.4 oz (82.3 kg)   SpO2 96%   BMI 32.13 kg/m  Wt Readings from Last 3 Encounters:  11/25/20 181 lb 6.4 oz (82.3 kg)  10/04/20 183 lb (83 kg)  08/25/20 183 lb (83 kg)     Health Maintenance Due  Topic Date Due   HIV Screening  Never done   Hepatitis C Screening  Never done   PAP SMEAR-Modifier  Never done   COLONOSCOPY (Pts 45-74yrs Insurance coverage will need to be confirmed)  Never done    There are no preventive care reminders to display for this patient.  Lab Results  Component Value Date   TSH 0.93 05/21/2019   Lab Results  Component Value Date    WBC 5.2 08/25/2020   HGB 11.2 (L) 08/25/2020   HCT 34.8 (L) 08/25/2020   MCV 71.2 (L) 08/25/2020   PLT 288.0 08/25/2020   Lab Results  Component Value Date   NA 138 08/25/2020   K 4.5 08/25/2020   CO2 25 08/25/2020   GLUCOSE 96 08/25/2020   BUN 14 08/25/2020   CREATININE 0.79 08/25/2020   BILITOT 0.3 08/25/2020   ALKPHOS 60 08/25/2020   AST 13 08/25/2020   ALT 14 08/25/2020   PROT 6.7 08/25/2020   ALBUMIN 4.1 08/25/2020   CALCIUM 9.7 08/25/2020   ANIONGAP 7 09/23/2014   GFR 87.90 08/25/2020   Lab Results  Component Value Date   CHOL 144 05/21/2019   Lab Results  Component Value Date   HDL 49.10 05/21/2019   Lab Results  Component Value Date   LDLCALC 68 05/21/2019   Lab Results  Component Value Date   TRIG 132.0 05/21/2019   Lab Results  Component Value Date   CHOLHDL 3 05/21/2019   Lab Results  Component Value Date   HGBA1C 6.1 08/25/2020      Assessment & Plan:   Problem List Items Addressed This Visit       Other   Iron deficiency   Relevant Orders   CBC   Iron, TIBC and Ferritin Panel   Depression with anxiety   Elevated cholesterol   Relevant Orders   LDL cholesterol, direct   Elevated glucose   Relevant Orders   Basic metabolic panel   Need for influenza vaccination - Primary   Relevant Orders   Flu Vaccine QUAD 6+ mos PF IM (Fluarix Quad PF) (Completed)   Other Visit Diagnoses     Hematuria, unspecified type       Relevant Orders   Urinalysis, Routine w reflex microscopic   FSH       No orders of the defined types were placed in this encounter.   Follow-up: Return in about 6 months (around 05/25/2021), or if symptoms worsen or fail to improve.  Continue all medicines as above.  Advise follow-up with sports medicine.  Wear brace.  May need injection at this point.  Information given on iron deficiency anemia.  Encouraged her to take her iron regularly twice daily.  May need to increase to 3 times daily.   Mliss Sax, MD

## 2020-11-26 LAB — IRON,TIBC AND FERRITIN PANEL
%SAT: 35 % (calc) (ref 16–45)
Ferritin: 10 ng/mL — ABNORMAL LOW (ref 16–232)
Iron: 135 ug/dL (ref 40–190)
TIBC: 389 mcg/dL (calc) (ref 250–450)

## 2020-11-30 ENCOUNTER — Encounter: Payer: Self-pay | Admitting: Family Medicine

## 2020-12-06 ENCOUNTER — Ambulatory Visit: Payer: 59 | Admitting: Sports Medicine

## 2020-12-06 VITALS — Ht 63.0 in | Wt 181.0 lb

## 2020-12-06 DIAGNOSIS — M7711 Lateral epicondylitis, right elbow: Secondary | ICD-10-CM | POA: Diagnosis not present

## 2020-12-07 NOTE — Progress Notes (Signed)
   Subjective:    Patient ID: Jean Vazquez, female    DOB: 12-Oct-1970, 50 y.o.   MRN: [id number]  HPI   Jean Vazquez presents today with persistent right elbow pain secondary to lateral epicondylitis.  She was seen in the office on August 9 and prescribed meloxicam and given a cock up wrist brace.  Unfortunately, her symptoms persist.  She does admit that she is about 50% better but she is still having pain over the lateral epicondyle.  She has not found the wrist brace to be comfortable or helpful but she has found a counterforce brace to work better.  She also tried acupuncture which was not helpful.  She does find meloxicam to be very helpful and is taking it only on an as-needed basis.    Review of Systems As above    Objective:   Physical Exam  Well-developed, well-nourished.  No acute distress  Right elbow: Full range of motion.  No effusion.  No soft tissue swelling.  There is tenderness to palpation directly over the lateral epicondyle and reproducible pain with resisted ECRB testing.  Good pulses.  Good strength.      Assessment & Plan:   Persistent right elbow pain secondary to lateral epicondylitis  Formal physical therapy with the hand and rehab specialists.  Continue with her counterforce brace with activity.  She may discontinue her wrist brace.  She may also continue with meloxicam as needed.  Follow-up with me again in 4 weeks if symptoms or not improving.  I would consider imaging at that time.

## 2020-12-26 ENCOUNTER — Other Ambulatory Visit: Payer: Self-pay | Admitting: Family Medicine

## 2020-12-26 DIAGNOSIS — F418 Other specified anxiety disorders: Secondary | ICD-10-CM

## 2020-12-26 DIAGNOSIS — E7800 Pure hypercholesterolemia, unspecified: Secondary | ICD-10-CM

## 2020-12-26 DIAGNOSIS — K219 Gastro-esophageal reflux disease without esophagitis: Secondary | ICD-10-CM

## 2020-12-27 ENCOUNTER — Other Ambulatory Visit: Payer: Self-pay | Admitting: Family Medicine

## 2020-12-27 DIAGNOSIS — K219 Gastro-esophageal reflux disease without esophagitis: Secondary | ICD-10-CM

## 2020-12-27 DIAGNOSIS — E7800 Pure hypercholesterolemia, unspecified: Secondary | ICD-10-CM

## 2020-12-27 DIAGNOSIS — F418 Other specified anxiety disorders: Secondary | ICD-10-CM

## 2020-12-27 NOTE — Telephone Encounter (Signed)
Duplicate request, medications refilled 12/27/2020

## 2021-01-05 ENCOUNTER — Ambulatory Visit: Payer: 59 | Admitting: Sports Medicine

## 2021-03-20 ENCOUNTER — Other Ambulatory Visit: Payer: Self-pay | Admitting: Family Medicine

## 2021-03-20 DIAGNOSIS — F418 Other specified anxiety disorders: Secondary | ICD-10-CM

## 2021-03-20 DIAGNOSIS — E7800 Pure hypercholesterolemia, unspecified: Secondary | ICD-10-CM

## 2021-03-20 DIAGNOSIS — K219 Gastro-esophageal reflux disease without esophagitis: Secondary | ICD-10-CM

## 2021-03-21 MED ORDER — BUSPIRONE HCL 15 MG PO TABS: 15.0000 mg | ORAL_TABLET | Freq: Two times a day (BID) | ORAL | 0 refills | Status: DC

## 2021-03-21 MED ORDER — ATORVASTATIN CALCIUM 40 MG PO TABS: 40.0000 mg | ORAL_TABLET | Freq: Every day | ORAL | 0 refills | Status: DC

## 2021-03-21 MED ORDER — PANTOPRAZOLE SODIUM 40 MG PO TBEC: 40.0000 mg | DELAYED_RELEASE_TABLET | Freq: Every day | ORAL | 0 refills | Status: DC

## 2021-03-21 MED ORDER — SERTRALINE HCL 100 MG PO TABS: 200.0000 mg | ORAL_TABLET | Freq: Every day | ORAL | 0 refills | Status: DC

## 2021-05-02 ENCOUNTER — Ambulatory Visit: Payer: Managed Care, Other (non HMO) | Admitting: Sports Medicine

## 2021-05-02 VITALS — BP 150/94 | Ht 63.0 in | Wt 183.0 lb

## 2021-05-02 DIAGNOSIS — M722 Plantar fascial fibromatosis: Secondary | ICD-10-CM

## 2021-05-02 NOTE — Progress Notes (Signed)
? ?  Subjective:  ? ? Patient ID: Bryah Ocheltree, female    DOB: 09-Oct-1970, 51 y.o.   MRN: [id number] ? ?HPI chief complaint: Right heel pain ? ?Fonnie presents today complaining of 3 weeks of right heel pain that began after she purchased some new shoes.  She has since discontinued wearing no shoes but has persistent heel pain.  Pain is worse with first up in the morning and when first getting up from a seated position.  She tried some over-the-counter gel cups which were not helpful.  She also tried a dose of meloxicam which did seem to help.  She denies pain elsewhere in her foot or ankle. ? ?Interim medical history reviewed ?Medications reviewed ?Allergies reviewed ? ? ? ?Review of Systems ?As above ?   ?Objective:  ? Physical Exam ? ?Well-developed, well-nourished.  No acute distress ? ?Right heel: There is tenderness to palpation at the calcaneal origin of the plantar fascia.  No erythema.  No soft tissue swelling.  Negative calcaneal squeeze.  Neurovascularly intact distally. ? ? ?   ?Assessment & Plan:  ? ?Right heel Planter fasciitis ? ?Home exercise program consisting of plantar fascial stretches and calf stretches.  Daily icing either with ice water submersion or frozen water bottle.  She will also try a [place] sock at night.  She will resume her meloxicam 15 mg daily for 7 days with instructions to take it with food.  She may then take it as needed afterwards.  If symptoms persist, consider merits of formal physical therapy.  Follow-up for ongoing or recalcitrant issues. ? ?This note was dictated using Dragon naturally speaking software and may contain errors in syntax, spelling, or content which have not been identified prior to signing this note.  ? ?

## 2021-05-02 NOTE — Patient Instructions (Signed)
You have Planter fasciitis in your right foot. ?We are going to show you some plantar fascial stretches and some calf stretches to help with this. ?I also recommend that you try a [place] sock at night while sleeping to help keep the plantar fascia stretched. ?Take your meloxicam for the next 7 days (1 pill a day with food).  You can then take it as needed after that. ?Try icing your heel in an ice bath for 10 minutes at the end of the day.  If this is too uncomfortable, you can try frozen water bottle. ? ? ?

## 2021-05-26 ENCOUNTER — Encounter: Payer: Self-pay | Admitting: Family Medicine

## 2021-05-26 ENCOUNTER — Ambulatory Visit (INDEPENDENT_AMBULATORY_CARE_PROVIDER_SITE_OTHER): Payer: Managed Care, Other (non HMO) | Admitting: Family Medicine

## 2021-05-26 VITALS — BP 136/80 | HR 67 | Temp 97.9°F | Ht 63.0 in | Wt 185.6 lb

## 2021-05-26 DIAGNOSIS — E7800 Pure hypercholesterolemia, unspecified: Secondary | ICD-10-CM

## 2021-05-26 DIAGNOSIS — R319 Hematuria, unspecified: Secondary | ICD-10-CM

## 2021-05-26 DIAGNOSIS — Z23 Encounter for immunization: Secondary | ICD-10-CM

## 2021-05-26 DIAGNOSIS — F418 Other specified anxiety disorders: Secondary | ICD-10-CM

## 2021-05-26 DIAGNOSIS — E611 Iron deficiency: Secondary | ICD-10-CM | POA: Diagnosis not present

## 2021-05-26 DIAGNOSIS — Z1211 Encounter for screening for malignant neoplasm of colon: Secondary | ICD-10-CM

## 2021-05-26 LAB — COMPREHENSIVE METABOLIC PANEL
ALT: 18 U/L (ref 0–35)
AST: 17 U/L (ref 0–37)
Albumin: 4.4 g/dL (ref 3.5–5.2)
Alkaline Phosphatase: 55 U/L (ref 39–117)
BUN: 16 mg/dL (ref 6–23)
CO2: 28 mEq/L (ref 19–32)
Calcium: 10 mg/dL (ref 8.4–10.5)
Chloride: 105 mEq/L (ref 96–112)
Creatinine, Ser: 0.86 mg/dL (ref 0.40–1.20)
GFR: 78.97 mL/min (ref >60.00)
Glucose, Bld: 99 mg/dL (ref 70–99)
Potassium: 4.2 mEq/L (ref 3.5–5.1)
Sodium: 138 mEq/L (ref 135–145)
Total Bilirubin: 0.4 mg/dL (ref 0.2–1.2)
Total Protein: 7 g/dL (ref 6.0–8.3)

## 2021-05-26 LAB — URINALYSIS, ROUTINE W REFLEX MICROSCOPIC
Bilirubin Urine: NEGATIVE
Hgb urine dipstick: NEGATIVE
Ketones, ur: NEGATIVE
Leukocytes,Ua: NEGATIVE
Nitrite: NEGATIVE
Specific Gravity, Urine: <=1.005 — AB (ref 1.000–1.030)
Total Protein, Urine: NEGATIVE
Urine Glucose: NEGATIVE
Urobilinogen, UA: 0.2 (ref 0.0–1.0)
WBC, UA: NONE SEEN (ref 0–?)
pH: 6 (ref 5.0–8.0)

## 2021-05-26 LAB — CBC
HCT: 38.6 % (ref 36.0–46.0)
Hemoglobin: 12.4 g/dL (ref 12.0–15.0)
MCHC: 32.2 g/dL (ref 30.0–36.0)
MCV: 78.6 fl (ref 78.0–100.0)
Platelets: 253 10*3/uL (ref 150.0–400.0)
RBC: 4.91 Mil/uL (ref 3.87–5.11)
RDW: 19.6 % — ABNORMAL HIGH (ref 11.5–15.5)
WBC: 5.6 10*3/uL (ref 4.0–10.5)

## 2021-05-26 LAB — LDL CHOLESTEROL, DIRECT: Direct LDL: 84 mg/dL

## 2021-05-26 NOTE — Progress Notes (Signed)
? ?Established Patient Office Visit ? ?Subjective:  ?Patient ID: Jean Vazquez, female    DOB: 1970/12/30  Age: 51 y.o. MRN: [id number] ? ?CC:  ?Chief Complaint  ?Patient presents with  ?? Follow-up  ?  6 month follow, no concerns. Patient not fasting.   ? ? ?HPI ?Jean Vazquez presents for follow-up of anxiety with depression, elevated cholesterol, iron deficiency, anemia, hematuria.  Stressed recently with a new baby coming Casimiro Needle is due in August.  This will be there first.  Mom's hemoglobin A1c is been over 10.  Struggles with stress eating.  Continues with iron supplementation.  Pantoprazole current symptoms. ? ?Past Medical History:  ?Diagnosis Date  ?? Acid reflux   ?? Anemia   ?? Anxiety   ?? Depression   ?? Urinary incontinence   ? ? ?History reviewed. No pertinent surgical history. ? ?Family History  ?Problem Relation Age of Onset  ?? Heart disease Mother   ?? Diabetes Mother   ?? Cancer Father   ?     Lung  ?? Anxiety disorder Father   ?? Diabetes Father   ?? Diabetes type I Daughter   ?? Breast cancer Maternal Aunt   ? ? ?Social History  ? ?Socioeconomic History  ?? Marital status: Married  ?  Spouse name: Not on file  ?? Number of children: Not on file  ?? Years of education: Not on file  ?? Highest education level: Not on file  ?Occupational History  ?? Not on file  ?Tobacco Use  ?? Smoking status: Former  ?? Smokeless tobacco: Never  ?Vaping Use  ?? Vaping Use: Not on file  ?Substance and Sexual Activity  ?? Alcohol use: No  ?? Drug use: No  ?? Sexual activity: Not Currently  ?  Comment: 1st intercourse 51yo-Fewer than 5 partners  ?Other Topics Concern  ?? Not on file  ?Social History Narrative  ?? Not on file  ? ?Social Determinants of Health  ? ?Financial Resource Strain: Not on file  ?Food Insecurity: Not on file  ?Transportation Needs: Not on file  ?Physical Activity: Not on file  ?Stress: Not on file  ?Social Connections: Not on file  ?Intimate Partner Violence: Not on file   ? ? ?Outpatient Medications Prior to Visit  ?Medication Sig Dispense Refill  ?? aspirin 81 MG chewable tablet Chew 1 tablet (81 mg total) by mouth daily. 90 tablet 1  ?? atorvastatin (LIPITOR) 40 MG tablet Take 1 tablet (40 mg total) by mouth daily. 90 tablet 0  ?? busPIRone (BUSPAR) 15 MG tablet Take 1 tablet (15 mg total) by mouth 2 (two) times daily. 180 tablet 0  ?? docusate sodium (COLACE) 100 MG capsule Take 1 capsule (100 mg total) by mouth 2 (two) times daily. With iron tablets 10 capsule 0  ?? iron polysaccharides (NIFEREX) 150 MG capsule Take 1 capsule (150 mg total) by mouth daily. 30 capsule 5  ?? pantoprazole (PROTONIX) 40 MG tablet Take 1 tablet (40 mg total) by mouth daily. 90 tablet 0  ?? polyethylene glycol powder (GLYCOLAX/MIRALAX) 17 GM/SCOOP powder Take 17 g by mouth 2 (two) times daily as needed. 3350 g 1  ?? sertraline (ZOLOFT) 100 MG tablet Take 2 tablets (200 mg total) by mouth daily. 180 tablet 0  ?? meloxicam (MOBIC) 15 MG tablet Take 1 tablet daily with food for 7 days. Then take as needed. (Patient not taking: Reported on 11/25/2020) 30 tablet 0  ? ?No facility-administered medications prior to visit.  ? ? ?  No Known Allergies ? ?ROS ?Review of Systems  ?Constitutional:  Negative for diaphoresis, fatigue, fever and unexpected weight change.  ?HENT: Negative.    ?Eyes:  Negative for photophobia and visual disturbance.  ?Respiratory: Negative.    ?Cardiovascular: Negative.   ?Musculoskeletal:  Negative for gait problem and joint swelling.  ?Skin:  Negative for pallor and rash.  ?Neurological:  Negative for speech difficulty, weakness and light-headedness.  ?Psychiatric/Behavioral: Negative.    ? ?  ?Objective:  ?  ?Physical Exam ?Vitals and nursing note reviewed.  ?Constitutional:   ?   General: She is not in acute distress. ?   Appearance: Normal appearance. She is not ill-appearing, toxic-appearing or diaphoretic.  ?HENT:  ?   Head: Normocephalic and atraumatic.  ?   Right Ear: Tympanic  membrane, ear canal and external ear normal.  ?   Left Ear: Tympanic membrane, ear canal and external ear normal.  ?   Mouth/Throat:  ?   Mouth: Mucous membranes are moist.  ?   Pharynx: Oropharynx is clear. No oropharyngeal exudate or posterior oropharyngeal erythema.  ?Eyes:  ?   General: No scleral icterus.    ?   Right eye: No discharge.     ?   Left eye: No discharge.  ?   Extraocular Movements: Extraocular movements intact.  ?   Conjunctiva/sclera: Conjunctivae normal.  ?   Pupils: Pupils are equal, round, and reactive to light.  ?Neck:  ?   Vascular: No carotid bruit.  ?Cardiovascular:  ?   Rate and Rhythm: Normal rate and regular rhythm.  ?Pulmonary:  ?   Effort: Pulmonary effort is normal.  ?   Breath sounds: Normal breath sounds.  ?Abdominal:  ?   General: Bowel sounds are normal.  ?Musculoskeletal:  ?   Cervical back: No rigidity or tenderness.  ?Lymphadenopathy:  ?   Cervical: No cervical adenopathy.  ?Skin: ?   General: Skin is warm and dry.  ?Neurological:  ?   Mental Status: She is alert and oriented to person, place, and time.  ?Psychiatric:     ?   Mood and Affect: Mood normal.     ?   Behavior: Behavior normal.  ? ? ?BP 136/80 (BP Location: Right Arm, Patient Position: Sitting, Cuff Size: Large)   Pulse 67   Temp 97.9 ?F (36.6 ?C) (Temporal)   Ht 5\' 3"  (1.6 m)   Wt 185 lb 9.6 oz (84.2 kg)   SpO2 96%   BMI 32.88 kg/m?  ?Wt Readings from Last 3 Encounters:  ?05/26/21 185 lb 9.6 oz (84.2 kg)  ?05/02/21 183 lb (83 kg)  ?12/06/20 181 lb (82.1 kg)  ? ? ? ?Health Maintenance Due  ?Topic Date Due  ?? HIV Screening  Never done  ?? Hepatitis C Screening  Never done  ?? Zoster Vaccines- Shingrix (1 of 2) Never done  ? ? ?There are no preventive care reminders to display for this patient. ? ?Lab Results  ?Component Value Date  ? TSH 0.93 05/21/2019  ? ?Lab Results  ?Component Value Date  ? WBC 5.5 11/25/2020  ? HGB 12.6 11/25/2020  ? HCT 39.5 11/25/2020  ? MCV 80.0 11/25/2020  ? PLT 258.0 11/25/2020   ? ?Lab Results  ?Component Value Date  ? NA 138 11/25/2020  ? K 4.5 11/25/2020  ? CO2 24 11/25/2020  ? GLUCOSE 92 11/25/2020  ? BUN 16 11/25/2020  ? CREATININE 0.91 11/25/2020  ? BILITOT 0.3 08/25/2020  ? ALKPHOS 60 08/25/2020  ?  AST 13 08/25/2020  ? ALT 14 08/25/2020  ? PROT 6.7 08/25/2020  ? ALBUMIN 4.1 08/25/2020  ? CALCIUM 9.8 11/25/2020  ? ANIONGAP 7 09/23/2014  ? GFR 74.05 11/25/2020  ? ?Lab Results  ?Component Value Date  ? CHOL 144 05/21/2019  ? ?Lab Results  ?Component Value Date  ? HDL 49.10 05/21/2019  ? ?Lab Results  ?Component Value Date  ? LDLCALC 68 05/21/2019  ? ?Lab Results  ?Component Value Date  ? TRIG 132.0 05/21/2019  ? ?Lab Results  ?Component Value Date  ? CHOLHDL 3 05/21/2019  ? ?Lab Results  ?Component Value Date  ? HGBA1C 6.1 08/25/2020  ? ? ?  ?Assessment & Plan:  ? ?Problem List Items Addressed This Visit   ? ?  ? Other  ? Screen for colon cancer  ? Relevant Orders  ? Ambulatory referral to Gastroenterology  ? Iron deficiency - Primary  ? Relevant Orders  ? CBC  ? Iron, TIBC and Ferritin Panel  ? Depression with anxiety  ? Elevated cholesterol  ? Relevant Orders  ? Comprehensive metabolic panel  ? LDL cholesterol, direct  ? Need for shingles vaccine  ? Relevant Orders  ? Varicella-zoster vaccine IM (Shingrix)  ? Hematuria  ? Relevant Orders  ? Urinalysis, Routine w reflex microscopic  ? ? ?No orders of the defined types were placed in this encounter. ? ? ?Follow-up: Return in about 6 months (around 11/25/2021), or if symptoms worsen or fail to improve.  ? ? ?Mliss Sax, MD ?

## 2021-05-27 LAB — IRON,TIBC AND FERRITIN PANEL
%SAT: 15 % (calc) — ABNORMAL LOW (ref 16–45)
Ferritin: 15 ng/mL — ABNORMAL LOW (ref 16–232)
Iron: 59 ug/dL (ref 45–160)
TIBC: 384 mcg/dL (calc) (ref 250–450)

## 2021-06-01 ENCOUNTER — Encounter: Payer: Self-pay | Admitting: Internal Medicine

## 2021-06-01 ENCOUNTER — Ambulatory Visit (AMBULATORY_SURGERY_CENTER): Payer: Self-pay

## 2021-06-01 VITALS — Ht 63.0 in | Wt 185.0 lb

## 2021-06-01 DIAGNOSIS — Z1211 Encounter for screening for malignant neoplasm of colon: Secondary | ICD-10-CM

## 2021-06-01 MED ORDER — ONDANSETRON HCL 4 MG PO TABS: 4.0000 mg | ORAL_TABLET | ORAL | 0 refills | Status: DC

## 2021-06-01 NOTE — Progress Notes (Signed)
No egg or soy allergy known to patient  ?No issues known to pt with past sedation with any surgeries or procedures ?Patient denies ever being told they had issues or difficulty with intubation  ?No FH of Malignant Hyperthermia ?Pt is not on diet pills ?Pt is not on  home 02  ?Pt is not on blood thinners  ?Pt denies issues with constipation  ?No A fib or A flutter ? ?Pt asks to move up procedure to Tuesday 4/11, appt Dr Leone Payor had available. ? ? ? ? ?Due to the COVID-19 pandemic we are asking patients to follow certain guidelines in PV and the [facility]   ?Pt aware of COVID protocols and [facility] guidelines  ? ?PV completed over the phone. Pt verified name, DOB, address and insurance during PV today.  ?Pt mailed instruction packet with copy of consent form to read and not return, and instructions.  ?Pt encouraged to call with questions or issues.  ?If pt has My chart, procedure instructions sent via My Chart  ? ?

## 2021-06-06 ENCOUNTER — Encounter: Payer: Self-pay | Admitting: Internal Medicine

## 2021-06-06 ENCOUNTER — Ambulatory Visit (AMBULATORY_SURGERY_CENTER): Payer: Managed Care, Other (non HMO) | Admitting: Internal Medicine

## 2021-06-06 VITALS — BP 123/77 | HR 73 | Temp 95.0°F | Resp 18 | Ht 63.0 in | Wt 185.0 lb

## 2021-06-06 DIAGNOSIS — Z1211 Encounter for screening for malignant neoplasm of colon: Secondary | ICD-10-CM | POA: Diagnosis present

## 2021-06-06 MED ORDER — FLEET ENEMA 7-19 GM/118ML RE ENEM
1.0000 | ENEMA | Freq: Once | RECTAL | Status: AC
  Administered 2021-06-06: 1 via RECTAL

## 2021-06-06 MED ORDER — SODIUM CHLORIDE 0.9 % IV SOLN: 500.0000 mL | INTRAVENOUS | Status: DC

## 2021-06-06 NOTE — Progress Notes (Signed)
[facility] History and Physical ? ? ?Primary Care Physician:  Mliss Sax, MD ? ? ?Reason for Procedure:   CRCA screening ? ?Plan:    colonoscopy ? ? ? ? ?HPI: Jean Vazquez is a 51 y.o. female here for CRCA screening exam ? ? ?Past Medical History:  ?Diagnosis Date  ? Acid reflux   ? Anemia 2015  ? IDA  ? Anxiety   ? Depression   ? Hyperlipidemia   ? Urinary incontinence   ? ? ?History reviewed. No pertinent surgical history. ? ?Prior to Admission medications   ?Medication Sig Start Date End Date Taking? Authorizing Provider  ?aspirin 81 MG chewable tablet Chew 1 tablet (81 mg total) by mouth daily. 05/22/19  Yes Mliss Sax, MD  ?atorvastatin (LIPITOR) 40 MG tablet Take 1 tablet (40 mg total) by mouth daily. 03/21/21  Yes Mliss Sax, MD  ?busPIRone (BUSPAR) 15 MG tablet Take 1 tablet (15 mg total) by mouth 2 (two) times daily. 03/21/21  Yes Mliss Sax, MD  ?Omega-3 Fatty Acids (FISH OIL) 1000 MG CAPS Take by mouth.   Yes [provider]  ?pantoprazole (PROTONIX) 40 MG tablet Take 1 tablet (40 mg total) by mouth daily. 03/21/21  Yes Mliss Sax, MD  ?sertraline (ZOLOFT) 100 MG tablet Take 2 tablets (200 mg total) by mouth daily. 03/21/21  Yes Mliss Sax, MD  ?docusate sodium (COLACE) 100 MG capsule Take 1 capsule (100 mg total) by mouth 2 (two) times daily. With iron tablets 11/24/19   Mliss Sax, MD  ?iron polysaccharides (NIFEREX) 150 MG capsule Take 1 capsule (150 mg total) by mouth daily. 05/20/19   Mliss Sax, MD  ?ondansetron (ZOFRAN) 4 MG tablet Take 1 tablet (4 mg total) by mouth as directed for 2 doses. 06/01/21   Iva Boop, MD  ? ? ?Current Outpatient Medications  ?Medication Sig Dispense Refill  ? aspirin 81 MG chewable tablet Chew 1 tablet (81 mg total) by mouth daily. 90 tablet 1  ? atorvastatin (LIPITOR) 40 MG tablet Take 1 tablet (40 mg total) by mouth daily. 90 tablet 0  ? busPIRone  (BUSPAR) 15 MG tablet Take 1 tablet (15 mg total) by mouth 2 (two) times daily. 180 tablet 0  ? Omega-3 Fatty Acids (FISH OIL) 1000 MG CAPS Take by mouth.    ? pantoprazole (PROTONIX) 40 MG tablet Take 1 tablet (40 mg total) by mouth daily. 90 tablet 0  ? sertraline (ZOLOFT) 100 MG tablet Take 2 tablets (200 mg total) by mouth daily. 180 tablet 0  ? docusate sodium (COLACE) 100 MG capsule Take 1 capsule (100 mg total) by mouth 2 (two) times daily. With iron tablets 10 capsule 0  ? iron polysaccharides (NIFEREX) 150 MG capsule Take 1 capsule (150 mg total) by mouth daily. 30 capsule 5  ? ondansetron (ZOFRAN) 4 MG tablet Take 1 tablet (4 mg total) by mouth as directed for 2 doses. 2 tablet 0  ? ?Current Facility-Administered Medications  ?Medication Dose Route Frequency Provider Last Rate Last Admin  ? 0.9 %  sodium chloride infusion  500 mL Intravenous Continuous Iva Boop, MD      ? ? ?Allergies as of 06/06/2021  ? (No Known Allergies)  ? ? ?Family History  ?Problem Relation Age of Onset  ? Heart disease Mother   ? Diabetes Mother   ? Cancer Father   ?     Lung  ? Anxiety disorder Father   ?  Diabetes Father   ? Breast cancer Maternal Aunt   ? Diabetes type I Daughter   ? Colon cancer Neg Hx   ? Colon polyps Neg Hx   ? Esophageal cancer Neg Hx   ? Stomach cancer Neg Hx   ? Rectal cancer Neg Hx   ? ? ?Social History  ? ?Socioeconomic History  ? Marital status: Married  ?  Spouse name: Not on file  ? Number of children: Not on file  ? Years of education: Not on file  ? Highest education level: Not on file  ?Occupational History  ? Not on file  ?Tobacco Use  ? Smoking status: Former  ?  Packs/day: 1.50  ?  Years: 25.00  ?  Pack years: 37.50  ?  Types: Cigarettes  ?  Quit date: 2012  ?  Years since quitting: 11.2  ?  Passive exposure: Past  ? Smokeless tobacco: Never  ?Vaping Use  ? Vaping Use: Not on file  ?Substance and Sexual Activity  ? Alcohol use: No  ? Drug use: Never  ? Sexual activity: Not Currently  ?   Comment: 1st intercourse 51yo-Fewer than 5 partners  ?Other Topics Concern  ? Not on file  ?Social History Narrative  ? Not on file  ? ?Social Determinants of Health  ? ?Financial Resource Strain: Not on file  ?Food Insecurity: Not on file  ?Transportation Needs: Not on file  ?Physical Activity: Not on file  ?Stress: Not on file  ?Social Connections: Not on file  ?Intimate Partner Violence: Not on file  ? ? ?Review of Systems: ? ?All other review of systems negative except as mentioned in the HPI. ? ?Physical Exam: ?Vital signs ?BP 123/81   Pulse 85   Temp (!) 95 ?F (35 ?C)   Ht 5\' 3"  (1.6 m)   Wt 185 lb (83.9 kg)   LMP 05/27/2021   SpO2 98%   BMI 32.77 kg/m?  ? ?General:   Alert,  Well-developed, well-nourished, pleasant and cooperative in NAD ?Lungs:  Clear throughout to auscultation.   ?Heart:  Regular rate and rhythm; no murmurs, clicks, rubs,  or gallops. ?Abdomen:  Soft, nontender and nondistended. Normal bowel sounds.   ?Neuro/Psych:  Alert and cooperative. Normal mood and affect. A and O x 3 ? ? ?@Bunyan Brier  07/27/2021, MD, ?[facility] ?[phone] (pager) ?06/06/2021 9:35 AM@ ? ?

## 2021-06-06 NOTE — Patient Instructions (Addendum)
No polyps or cancer were seen today. ? ?Next routine colonoscopy or other screening test in 10 years - 2033. ? ?I appreciate the opportunity to care for you. ?Iva Boop, MD, [facility] ? ? ?YOU HAD AN ENDOSCOPIC PROCEDURE TODAY AT THE  ENDOSCOPY CENTER:   Refer to the procedure report that was given to you for any specific questions about what was found during the examination.  If the procedure report does not answer your questions, please call your gastroenterologist to clarify.  If you requested that your care partner not be given the details of your procedure findings, then the procedure report has been included in a sealed envelope for you to review at your convenience later. ? ?YOU SHOULD EXPECT: Some feelings of bloating in the abdomen. Passage of more gas than usual.  Walking can help get rid of the air that was put into your GI tract during the procedure and reduce the bloating. If you had a lower endoscopy (such as a colonoscopy or flexible sigmoidoscopy) you may notice spotting of blood in your stool or on the toilet paper. If you underwent a bowel prep for your procedure, you may not have a normal bowel movement for a few days. ? ?Please Note:  You might notice some irritation and congestion in your nose or some drainage.  This is from the oxygen used during your procedure.  There is no need for concern and it should clear up in a day or so. ? ?SYMPTOMS TO REPORT IMMEDIATELY: ? ?Following lower endoscopy (colonoscopy or flexible sigmoidoscopy): ? Excessive amounts of blood in the stool ? Significant tenderness or worsening of abdominal pains ? Swelling of the abdomen that is new, acute ? Fever of 100?F or higher ? ? ?For urgent or emergent issues, a gastroenterologist can be reached at any hour by calling [phone]. ?Do not use MyChart messaging for urgent concerns.  ? ? ?DIET:  We do recommend a small meal at first, but then you may proceed to your regular diet.  Drink plenty of fluids but  you should avoid alcoholic beverages for 24 hours. ? ?ACTIVITY:  You should plan to take it easy for the rest of today and you should NOT DRIVE or use heavy machinery until tomorrow (because of the sedation medicines used during the test).   ? ?FOLLOW UP: ?Our staff will call the number listed on your records 48-72 hours following your procedure to check on you and address any questions or concerns that you may have regarding the information given to you following your procedure. If we do not reach you, we will leave a message.  We will attempt to reach you two times.  During this call, we will ask if you have developed any symptoms of COVID 19. If you develop any symptoms (ie: fever, flu-like symptoms, shortness of breath, cough etc.) before then, please call [phone].  If you test positive for Covid 19 in the 2 weeks post procedure, please call and report this information to [place].   ? ?If any biopsies were taken you will be contacted by phone or by letter within the next 1-3 weeks.  Please call us at [phone] if you have not heard about the biopsies in 3 weeks.  ? ? ?SIGNATURES/CONFIDENTIALITY: ?You and/or your care partner have signed paperwork which will be entered into your electronic medical record.  These signatures attest to the fact that that the information above on your After Visit Summary has been reviewed and is  understood.  Full responsibility of the confidentiality of this discharge information lies with you and/or your care-partner. ? ?

## 2021-06-06 NOTE — Op Note (Signed)
[facility] ?Patient Name: Jean Vazquez ?Procedure Date: 06/06/2021 9:32 AM ?MRN: [id number] ?Endoscopist: Jean Boop , MD ?Age: 51 ?Referring MD:  ?Date of Birth: 1970/05/19 ?Gender: Female ?Account #: [id number] ?Procedure:                Colonoscopy ?Indications:              Screening for colorectal malignant neoplasm, This  ?                          is the patient's first colonoscopy ?Medicines:                Monitored Anesthesia Care ?Procedure:                Pre-Anesthesia Assessment: ?                          - Prior to the procedure, a History and Physical  ?                          was performed, and patient medications and  ?                          allergies were reviewed. The patient's tolerance of  ?                          previous anesthesia was also reviewed. The risks  ?                          and benefits of the procedure and the sedation  ?                          options and risks were discussed with the patient.  ?                          All questions were answered, and informed consent  ?                          was obtained. Prior Anticoagulants: The patient has  ?                          taken no previous anticoagulant or antiplatelet  ?                          agents. ASA Grade Assessment: II - A patient with  ?                          mild systemic disease. After reviewing the risks  ?                          and benefits, the patient was deemed in  ?                          satisfactory condition to undergo the procedure. ?  After obtaining informed consent, the colonoscope  ?                          was passed under direct vision. Throughout the  ?                          procedure, the patient's blood pressure, pulse, and  ?                          oxygen saturations were monitored continuously. The  ?                          Colonoscope was introduced through the anus and  ?                          advanced to the the  cecum, identified by  ?                          appendiceal orifice and ileocecal valve. The  ?                          colonoscopy was performed without difficulty. The  ?                          patient tolerated the procedure well. The quality  ?                          of the bowel preparation was good. The ileocecal  ?                          valve, appendiceal orifice, and rectum were  ?                          photographed. The bowel preparation used was  ?                          Miralax via split dose instruction. ?Scope In: 9:42:37 AM ?Scope Out: 9:54:22 AM ?Scope Withdrawal Time: 0 hours 9 minutes 10 seconds  ?Total Procedure Duration: 0 hours 11 minutes 45 seconds  ?Findings:                 The perianal and digital rectal examinations were  ?                          normal. ?                          The entire examined colon appeared normal on direct  ?                          and retroflexion views. ?Complications:            No immediate complications. ?Estimated Blood Loss:     Estimated blood loss: none. ?Impression:               - The entire examined colon is  normal on direct and  ?                          retroflexion views. ?                          - No specimens collected. ?Recommendation:           - Patient has a contact number available for  ?                          emergencies. The signs and symptoms of potential  ?                          delayed complications were discussed with the  ?                          patient. Return to normal activities tomorrow.  ?                          Written discharge instructions were provided to the  ?                          patient. ?                          - Resume previous diet. ?                          - Continue present medications. ?                          - Repeat colonoscopy in 10 years for screening  ?                          purposes. Use different prep - she vomited w/  ?                          second half of  MiraLax ?Jean Booparl E [name], MD ?06/06/2021 10:00:44 AM ?This report has been signed electronically. ?

## 2021-06-06 NOTE — Progress Notes (Signed)
Pt non-responsive, VVS, Report to RN  °

## 2021-06-08 ENCOUNTER — Telehealth: Payer: Self-pay

## 2021-06-08 NOTE — Telephone Encounter (Signed)
?  Follow up Call- ? ? ?  06/06/2021  ?  9:20 AM  ?Call back number  ?Post procedure Call Back phone  # [phone]  ?Permission to leave phone message Yes  ?  ? ?Patient questions: ? ?Do you have a fever, pain , or abdominal swelling? No. ?Pain Score  0 * ? ?Have you tolerated food without any problems? Yes.   ? ?Have you been able to return to your normal activities? Yes.   ? ?Do you have any questions about your discharge instructions: ?Diet   No. ?Medications  No. ?Follow up visit  No. ? ?Do you have questions or concerns about your Care? No. ? ?Actions: ?* If pain score is 4 or above: ?No action needed, pain <4. ? ?Have you developed a fever since your procedure? no ? ?2.   Have you had an respiratory symptoms (SOB or cough) since your procedure? no ? ?3.   Have you tested positive for COVID 19 since your procedure no ? ?4.   Have you had any family members/close contacts diagnosed with the COVID 19 since your procedure?  no ? ? ?If yes to any of these questions please route to Laverna Peace, RN and Karlton Lemon, RN  ? ? ?

## 2021-06-19 ENCOUNTER — Other Ambulatory Visit: Payer: Self-pay | Admitting: Family Medicine

## 2021-06-19 DIAGNOSIS — K219 Gastro-esophageal reflux disease without esophagitis: Secondary | ICD-10-CM

## 2021-06-19 DIAGNOSIS — E7800 Pure hypercholesterolemia, unspecified: Secondary | ICD-10-CM

## 2021-06-19 DIAGNOSIS — F418 Other specified anxiety disorders: Secondary | ICD-10-CM

## 2021-06-29 ENCOUNTER — Encounter: Payer: Managed Care, Other (non HMO) | Admitting: Internal Medicine

## 2021-11-24 ENCOUNTER — Encounter: Payer: Self-pay | Admitting: Family Medicine

## 2021-11-24 ENCOUNTER — Ambulatory Visit (INDEPENDENT_AMBULATORY_CARE_PROVIDER_SITE_OTHER): Payer: Commercial Managed Care - HMO | Admitting: Family Medicine

## 2021-11-24 VITALS — BP 128/78 | HR 63 | Temp 96.5°F | Ht 63.0 in | Wt 187.2 lb

## 2021-11-24 DIAGNOSIS — E7800 Pure hypercholesterolemia, unspecified: Secondary | ICD-10-CM | POA: Diagnosis not present

## 2021-11-24 DIAGNOSIS — Z23 Encounter for immunization: Secondary | ICD-10-CM

## 2021-11-24 DIAGNOSIS — R7309 Other abnormal glucose: Secondary | ICD-10-CM | POA: Diagnosis not present

## 2021-11-24 DIAGNOSIS — F418 Other specified anxiety disorders: Secondary | ICD-10-CM

## 2021-11-24 DIAGNOSIS — E611 Iron deficiency: Secondary | ICD-10-CM

## 2021-11-24 DIAGNOSIS — E559 Vitamin D deficiency, unspecified: Secondary | ICD-10-CM | POA: Diagnosis not present

## 2021-11-24 LAB — BASIC METABOLIC PANEL
BUN: 16 mg/dL (ref 6–23)
CO2: 25 mEq/L (ref 19–32)
Calcium: 9.9 mg/dL (ref 8.4–10.5)
Chloride: 106 mEq/L (ref 96–112)
Creatinine, Ser: 0.9 mg/dL (ref 0.40–1.20)
GFR: 74.51 mL/min (ref >60.00)
Glucose, Bld: 105 mg/dL — ABNORMAL HIGH (ref 70–99)
Potassium: 4.5 mEq/L (ref 3.5–5.1)
Sodium: 138 mEq/L (ref 135–145)

## 2021-11-24 LAB — CBC
HCT: 35.1 % — ABNORMAL LOW (ref 36.0–46.0)
Hemoglobin: 11.6 g/dL — ABNORMAL LOW (ref 12.0–15.0)
MCHC: 32.9 g/dL (ref 30.0–36.0)
MCV: 81.3 fl (ref 78.0–100.0)
Platelets: 269 10*3/uL (ref 150.0–400.0)
RBC: 4.32 Mil/uL (ref 3.87–5.11)
RDW: 15.2 % (ref 11.5–15.5)
WBC: 5.8 10*3/uL (ref 4.0–10.5)

## 2021-11-24 LAB — VITAMIN D 25 HYDROXY (VIT D DEFICIENCY, FRACTURES): VITD: 34.42 ng/mL (ref 30.00–100.00)

## 2021-11-24 LAB — LDL CHOLESTEROL, DIRECT: Direct LDL: 86 mg/dL

## 2021-11-24 LAB — HEMOGLOBIN A1C: Hgb A1c MFr Bld: 6.1 % (ref 4.6–6.5)

## 2021-11-24 NOTE — Progress Notes (Signed)
Established Patient Office Visit  Subjective   Patient ID: Jean Vazquez, female    DOB: Jul 08, 1970  Age: 51 y.o. MRN: [id number]  Chief Complaint  Patient presents with   Follow-up    6 month follow up, patient not fasting. Would like to discuss something for weight loss.     HPI for follow-up of elevated cholesterol, iron deficiency, and morbid obesity.  Admits to stress eating and eating past the point of satiety.  She stays busy helping her daughter with her newborn.  She works as a Investment banker, operational.  She has no regular exercise.  She really has not tried any particular diet to lose weight.  She has heard that medications can help.  Continues with iron therapy.  Continues with atorvastatin and fish oil.  She is not fasting this morning.  Continues with sertraline and buspirone for anxiety and depression with good effect.    Review of Systems  Constitutional: Negative.   HENT: Negative.    Eyes:  Negative for blurred vision, discharge and redness.  Respiratory: Negative.    Cardiovascular: Negative.   Gastrointestinal:  Negative for abdominal pain.  Genitourinary: Negative.   Musculoskeletal: Negative.  Negative for myalgias.  Skin:  Negative for rash.  Neurological:  Negative for tingling, loss of consciousness and weakness.  Endo/Heme/Allergies:  Negative for polydipsia.      11/24/2021    8:58 AM 05/26/2021   10:04 AM 05/26/2021    9:09 AM  Depression screen PHQ 2/9  Decreased Interest 0 0 0  Down, Depressed, Hopeless 0 0 0  PHQ - 2 Score 0 0 0  Altered sleeping  0   Tired, decreased energy  1   Change in appetite  3   Feeling bad or failure about yourself   0   Trouble concentrating  3   Moving slowly or fidgety/restless  1   Suicidal thoughts  0   PHQ-9 Score  8   Difficult doing work/chores  Somewhat difficult        Objective:     BP 128/78 (BP Location: Right Arm, Patient Position: Sitting, Cuff Size: Normal)   Pulse 63   Temp (!) 96.5 F (35.8 C)  (Temporal)   Ht 5\' 3"  (1.6 m)   Wt 187 lb 3.2 oz (84.9 kg)   SpO2 98%   BMI 33.16 kg/m  Wt Readings from Last 3 Encounters:  11/24/21 187 lb 3.2 oz (84.9 kg)  06/06/21 185 lb (83.9 kg)  06/01/21 185 lb (83.9 kg)      Physical Exam Constitutional:      General: She is not in acute distress.    Appearance: Normal appearance. She is not ill-appearing, toxic-appearing or diaphoretic.  HENT:     Head: Normocephalic and atraumatic.     Right Ear: External ear normal.     Left Ear: External ear normal.     Mouth/Throat:     Mouth: Mucous membranes are moist.     Pharynx: Oropharynx is clear. No oropharyngeal exudate or posterior oropharyngeal erythema.  Eyes:     General: No scleral icterus.       Right eye: No discharge.        Left eye: No discharge.     Extraocular Movements: Extraocular movements intact.     Conjunctiva/sclera: Conjunctivae normal.     Pupils: Pupils are equal, round, and reactive to light.  Cardiovascular:     Rate and Rhythm: Normal rate and regular rhythm.  Pulmonary:  Effort: Pulmonary effort is normal. No respiratory distress.     Breath sounds: Normal breath sounds.  Abdominal:     General: Bowel sounds are normal.  Musculoskeletal:     Cervical back: No rigidity or tenderness.  Skin:    General: Skin is warm and dry.  Neurological:     Mental Status: She is alert and oriented to person, place, and time.  Psychiatric:        Mood and Affect: Mood normal.        Behavior: Behavior normal.      Results for orders placed or performed in visit on 11/24/21  Basic metabolic panel  Result Value Ref Range   Sodium 138 135 - 145 mEq/L   Potassium 4.5 3.5 - 5.1 mEq/L   Chloride 106 96 - 112 mEq/L   CO2 25 19 - 32 mEq/L   Glucose, Bld 105 (H) 70 - 99 mg/dL   BUN 16 6 - 23 mg/dL   Creatinine, Ser 9.37 0.40 - 1.20 mg/dL   GFR 90.24 >09.73 mL/min   Calcium 9.9 8.4 - 10.5 mg/dL  LDL cholesterol, direct  Result Value Ref Range   Direct LDL 86.0  mg/dL  Iron, TIBC and Ferritin Panel  Result Value Ref Range   Iron 67 45 - 160 mcg/dL   TIBC 532 992 - 426 mcg/dL (calc)   %SAT 16 16 - 45 % (calc)   Ferritin 5 (L) 16 - 232 ng/mL  Hemoglobin A1c  Result Value Ref Range   Hgb A1c MFr Bld 6.1 4.6 - 6.5 %  VITAMIN D 25 Hydroxy (Vit-D Deficiency, Fractures)  Result Value Ref Range   VITD 34.42 30.00 - 100.00 ng/mL  CBC  Result Value Ref Range   WBC 5.8 4.0 - 10.5 K/uL   RBC 4.32 3.87 - 5.11 Mil/uL   Platelets 269.0 150.0 - 400.0 K/uL   Hemoglobin 11.6 (L) 12.0 - 15.0 g/dL   HCT 83.4 (L) 19.6 - 22.2 %   MCV 81.3 78.0 - 100.0 fl   MCHC 32.9 30.0 - 36.0 g/dL   RDW 97.9 89.2 - 11.9 %      The 10-year ASCVD risk score (Arnett DK, et al., 2019) is: 0.9%    Assessment & Plan:   Problem List Items Addressed This Visit       Other   Iron deficiency - Primary   Relevant Medications   iron polysaccharides (NIFEREX) 150 MG capsule   Other Relevant Orders   Iron, TIBC and Ferritin Panel (Completed)   CBC (Completed)   Depression with anxiety   Elevated cholesterol   Relevant Orders   LDL cholesterol, direct (Completed)   Elevated glucose   Relevant Orders   Basic metabolic panel (Completed)   Hemoglobin A1c (Completed)   Need for influenza vaccination   Relevant Orders   Flu Vaccine QUAD 6+ mos PF IM (Fluarix Quad PF) (Completed)   Need for shingles vaccine   Relevant Orders   Varicella-zoster vaccine IM (Completed)   Morbid obesity (HCC)   Relevant Orders   Amb Ref to Medical Weight Management   Other Visit Diagnoses     Vitamin D deficiency       Relevant Orders   VITAMIN D 25 Hydroxy (Vit-D Deficiency, Fractures) (Completed)       Return in about 6 months (around 05/25/2022).  Encouraged regular exercise such as walking for 30 minutes most days of the week.  Agrees to go for medical weight loss management consultation.  Libby Maw, MD

## 2021-11-25 LAB — IRON,TIBC AND FERRITIN PANEL
%SAT: 16 % (calc) (ref 16–45)
Ferritin: 5 ng/mL — ABNORMAL LOW (ref 16–232)
Iron: 67 ug/dL (ref 45–160)
TIBC: 409 mcg/dL (calc) (ref 250–450)

## 2021-11-27 MED ORDER — POLYSACCHARIDE IRON COMPLEX 150 MG PO CAPS: 150.0000 mg | ORAL_CAPSULE | Freq: Two times a day (BID) | ORAL | 2 refills | Status: DC

## 2021-11-27 NOTE — Addendum Note (Signed)
Addended by: Jon Billings on: 11/27/2021 08:06 AM   Modules accepted: Orders

## 2021-11-30 ENCOUNTER — Telehealth: Payer: Self-pay | Admitting: Family Medicine

## 2021-11-30 NOTE — Telephone Encounter (Signed)
Caller Name: Carlei Huang Call back phone #: [phone]  Reason for Call: Has a couple questions about her lab results that were just recently taken, asked for a call back

## 2021-12-05 NOTE — Telephone Encounter (Signed)
Returned patients call no answer LM with responses from Dr. Ethelene Hal asked patient to call with any questions also sent message via [place].

## 2021-12-26 ENCOUNTER — Encounter (INDEPENDENT_AMBULATORY_CARE_PROVIDER_SITE_OTHER): Payer: Self-pay

## 2022-01-03 LAB — HM MAMMOGRAPHY

## 2022-01-04 ENCOUNTER — Encounter: Payer: Self-pay | Admitting: Family Medicine

## 2022-01-22 IMAGING — DX DG TIBIA/FIBULA 2V*R*
4 series · 4 of 4 positions shown · non-contrast
Comparison: None.

CLINICAL DATA: Right lower leg pain.

EXAM:
RIGHT TIBIA AND FIBULA - 2 VIEW

[lower leg ap]
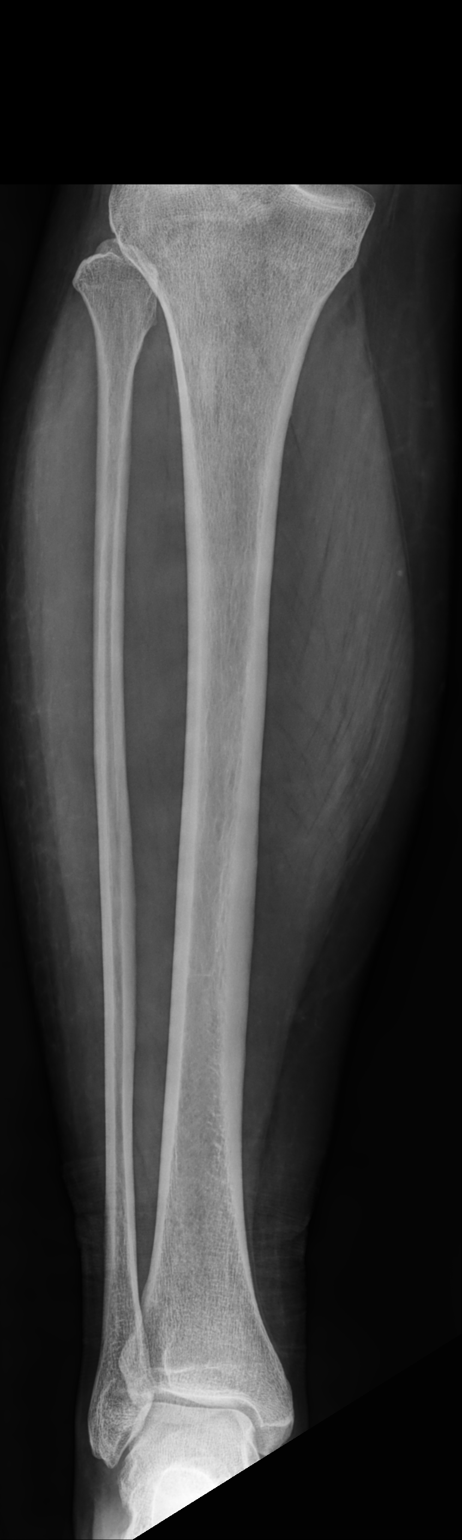

[lower leg lat]
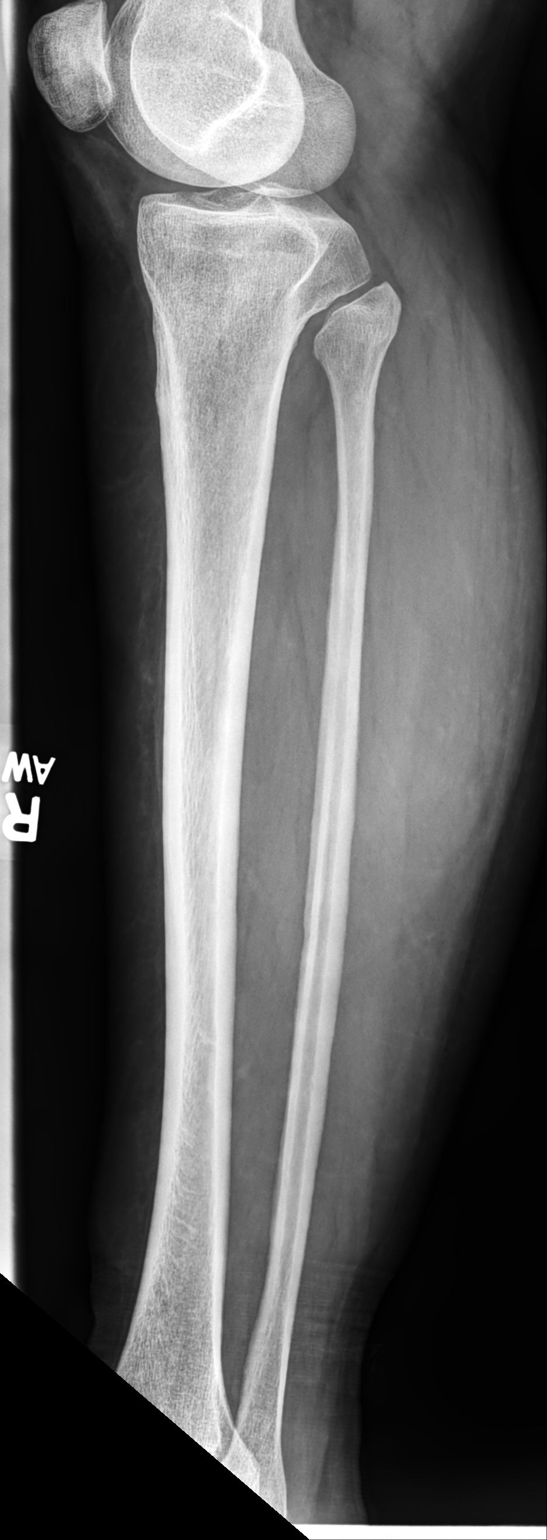

[lower leg with ankle lat]
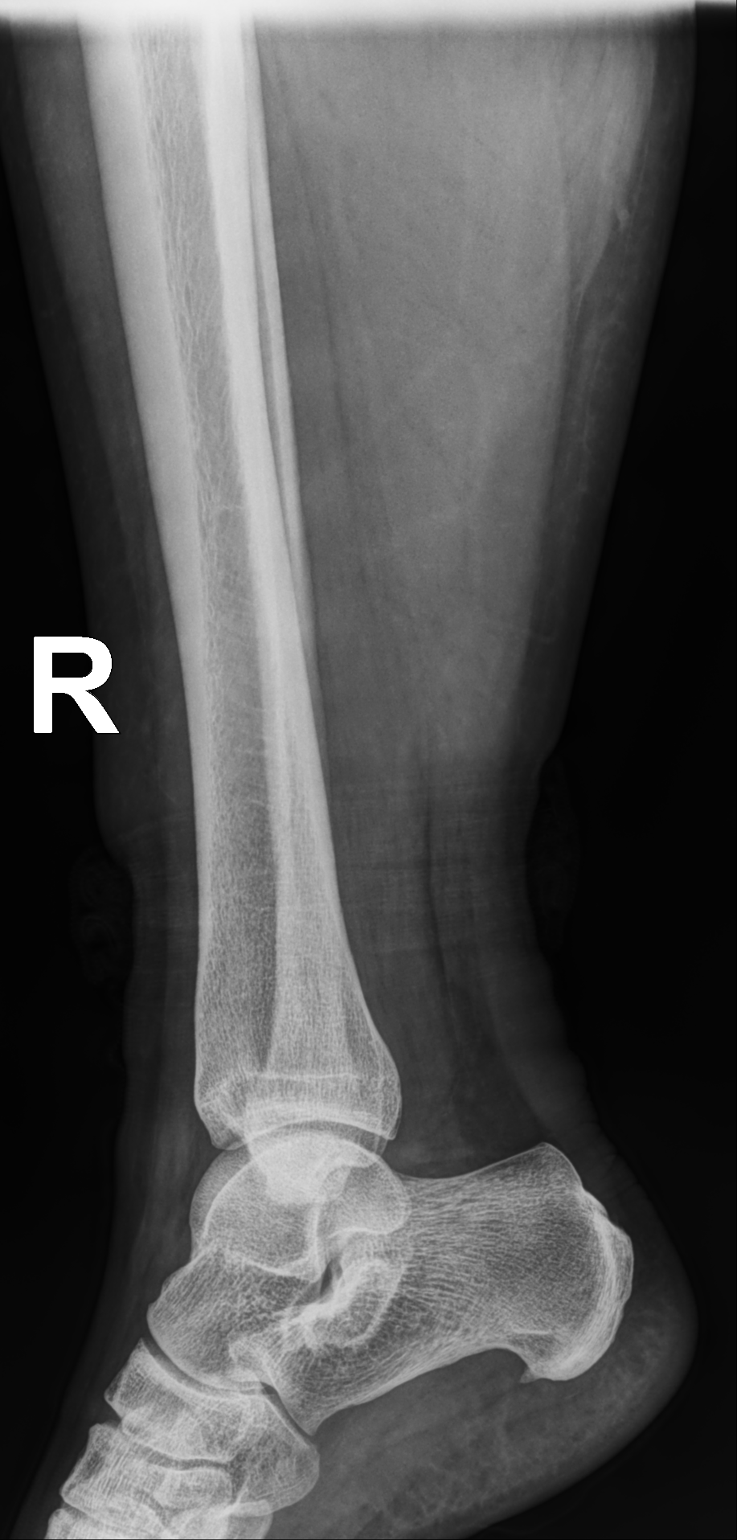

[lower leg with ankle ap]
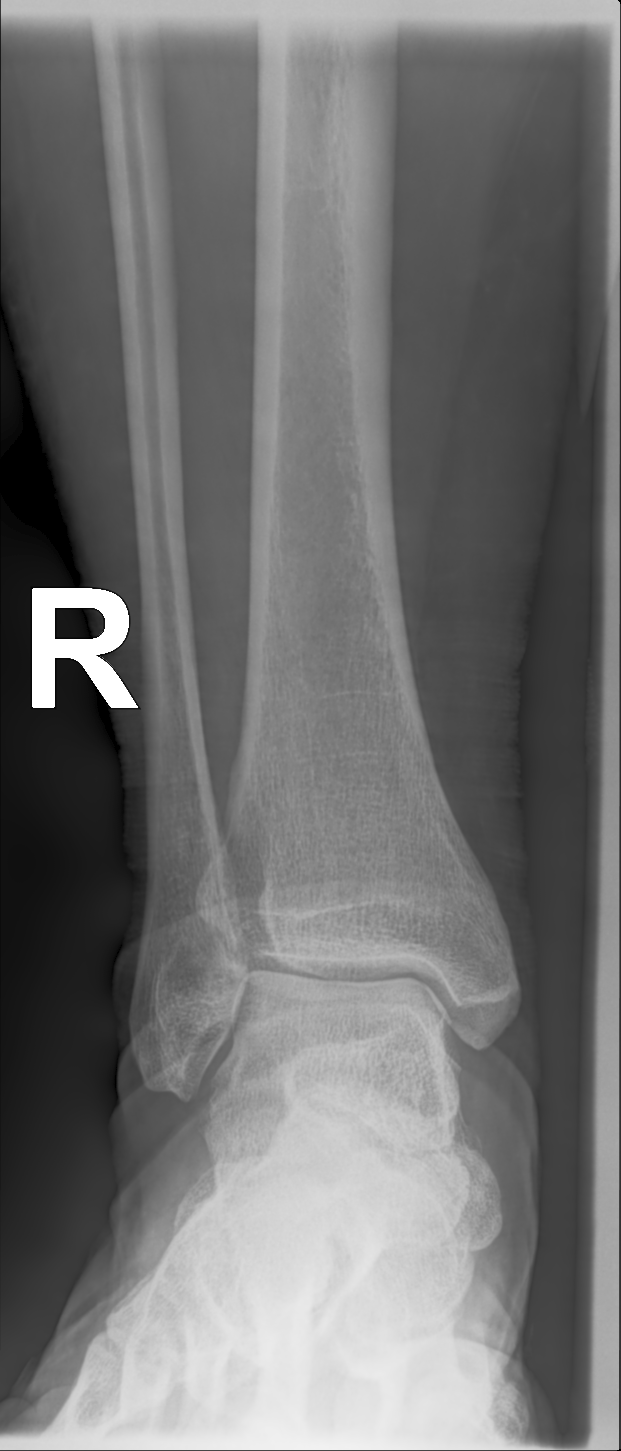

[4 of 4 positions shown; findings below may reference images not displayed]

FINDINGS: There is no evidence of fracture or other focal bone lesions.
Cortical margins are intact. Ankle and knee alignment are
maintained. Soft tissues are unremarkable.
IMPRESSION: Negative radiographs of the right lower leg.

## 2022-03-05 ENCOUNTER — Other Ambulatory Visit: Payer: Self-pay | Admitting: Family Medicine

## 2022-03-05 DIAGNOSIS — F418 Other specified anxiety disorders: Secondary | ICD-10-CM

## 2022-03-30 ENCOUNTER — Telehealth: Payer: Self-pay | Admitting: Family Medicine

## 2022-03-30 NOTE — Telephone Encounter (Signed)
Pt would like to have TOC from dr. Ethelene Hal to dr. Grandville Silos. Pt stated her son see dr. Grandville Silos for his anxiety and her son is happy and pt stated she has anxiety also and she was really impressed about dr.thompson

## 2022-03-30 NOTE — Telephone Encounter (Signed)
Both providers agree to [facility]

## 2022-03-30 NOTE — Telephone Encounter (Signed)
Please advise message below  °

## 2022-05-07 ENCOUNTER — Ambulatory Visit (HOSPITAL_BASED_OUTPATIENT_CLINIC_OR_DEPARTMENT_OTHER)
Admission: RE | Admit: 2022-05-07 | Discharge: 2022-05-07 | Disposition: A | Discharge status: Home / Self Care | Payer: Medicaid Other | Source: Ambulatory Visit | Attending: Family Medicine | Admitting: Family Medicine

## 2022-05-07 ENCOUNTER — Ambulatory Visit (INDEPENDENT_AMBULATORY_CARE_PROVIDER_SITE_OTHER): Payer: Medicaid Other | Admitting: Family Medicine

## 2022-05-07 ENCOUNTER — Encounter: Payer: Self-pay | Admitting: Family Medicine

## 2022-05-07 VITALS — BP 128/84 | HR 78 | Temp 97.8°F | Wt 196.2 lb

## 2022-05-07 DIAGNOSIS — K420 Umbilical hernia with obstruction, without gangrene: Secondary | ICD-10-CM | POA: Insufficient documentation

## 2022-05-07 DIAGNOSIS — N200 Calculus of kidney: Secondary | ICD-10-CM | POA: Diagnosis not present

## 2022-05-07 DIAGNOSIS — N3289 Other specified disorders of bladder: Secondary | ICD-10-CM | POA: Diagnosis not present

## 2022-05-07 DIAGNOSIS — K429 Umbilical hernia without obstruction or gangrene: Secondary | ICD-10-CM | POA: Insufficient documentation

## 2022-05-07 LAB — BASIC METABOLIC PANEL
BUN: 13 mg/dL (ref 6–23)
CO2: 26 mEq/L (ref 19–32)
Calcium: 10.2 mg/dL (ref 8.4–10.5)
Chloride: 102 mEq/L (ref 96–112)
Creatinine, Ser: 0.94 mg/dL (ref 0.40–1.20)
GFR: 70.5 mL/min (ref >60.00)
Glucose, Bld: 98 mg/dL (ref 70–99)
Potassium: 4.4 mEq/L (ref 3.5–5.1)
Sodium: 137 mEq/L (ref 135–145)

## 2022-05-07 LAB — HCG, QUANTITATIVE, PREGNANCY: Quantitative HCG: <0.6 m[IU]/mL

## 2022-05-07 LAB — POCT URINE PREGNANCY: Preg Test, Ur: NEGATIVE

## 2022-05-07 MED ORDER — CLONAZEPAM 0.5 MG PO TABS: 0.5000 mg | ORAL_TABLET | Freq: Once | ORAL | 0 refills | Status: DC | PRN

## 2022-05-07 NOTE — Assessment & Plan Note (Signed)
Concern for umbilical hernia with partial obstruction (primary consideration due to palpable bulge and localized pain exacerbated by straining) with intermittent severe nausea and pain requiring patient to lie down for several hours  Plan:  Order a CT Abdomen Pelvis Wo Contrast to confirm diagnosis and evaluate the extent. Arrange for potential surgical consultation based on imaging results. Advise patient to monitor for signs of increased pain, nausea, vomiting, changes in bowel habits, or any sign of infection and seek immediate medical attention if symptoms worsen. Prescribe clonazePAM (KLONOPIN) 0.5 MG tablet as a short-term anxiolytic to be used on the day of the CT scan if required due to anxiety about the procedure. Provide patient with education on managing hernia symptoms and avoiding heavy lifting or straining. Perform Basic Metabolic Panel (BMET) to check kidney function prior to the contrast CT. Conduct a POCT urine pregnancy test before the CT scan due to the patient's gender and age, regardless of reported past hysterectomy, to meet radiology protocol requirements. Secure a referral to general surgery for likely surgical intervention, emphasizing the aim for elective rather than emergency surgery.

## 2022-05-07 NOTE — Progress Notes (Signed)
Assessment/Plan:   Problem List Items Addressed This Visit       Digestive   Umbilical hernia with obstruction, without gangrene - Primary    Concern for umbilical hernia with partial obstruction (primary consideration due to palpable bulge and localized pain exacerbated by straining) with intermittent severe nausea and pain requiring patient to lie down for several hours  Plan:  Order a CT Abdomen Pelvis Wo Contrast to confirm diagnosis and evaluate the extent. Arrange for potential surgical consultation based on imaging results. Advise patient to monitor for signs of increased pain, nausea, vomiting, changes in bowel habits, or any sign of infection and seek immediate medical attention if symptoms worsen. Prescribe clonazePAM (KLONOPIN) 0.5 MG tablet as a short-term anxiolytic to be used on the day of the CT scan if required due to anxiety about the procedure. Provide patient with education on managing hernia symptoms and avoiding heavy lifting or straining. Perform Basic Metabolic Panel (BMET) to check kidney function prior to the contrast CT. Conduct a POCT urine pregnancy test before the CT scan due to the patient's gender and age, regardless of reported past hysterectomy, to meet radiology protocol requirements. Secure a referral to general surgery for likely surgical intervention, emphasizing the aim for elective rather than emergency surgery.      Relevant Medications   clonazePAM (KLONOPIN) 0.5 MG tablet   Other Relevant Orders   CT Abdomen Pelvis Wo Contrast   POCT urine pregnancy   Ambulatory referral to General Surgery   Basic Metabolic Panel (BMET)   hCG, quantitative, pregnancy    There are no discontinued medications.    Subjective:  HPI: Encounter date: 05/07/2022  Jean Vazquez is a 52 y.o. female who has Screen for colon cancer; Achilles tendinitis of right lower extremity; Iron deficiency; Depression with anxiety; Gastroesophageal reflux disease;  Elevated cholesterol; Epigastric pain; Drug-induced constipation; Stress incontinence; Menorrhagia with regular cycle; Medication side effect; Leg pain, lateral, right; Tendonitis of right peroneus longus tendon; Microcytic anemia; Elevated glucose; Need for influenza vaccination; Adjustment disorder with anxiety; Iron deficiency anemia due to chronic blood loss; Anxiety and depression; Need for shingles vaccine; Hematuria; Morbid obesity ([place]); and Umbilical hernia with obstruction, without gangrene on their problem list..   She  has a past medical history of Acid reflux, Anemia (2015), Anxiety, Depression, Hyperlipidemia, and Urinary incontinence..   CHIEF COMPLAINT: Umbilical hernia with associated pain and discomfort.  HISTORY OF PRESENT ILLNESS: Jean Vazquez, a 52 year old female, presents with concerns about a possible hernia on the lower right abdomen that has been present for approximately 4 weeks. The patient describes the pain as located around the umbilicus, with a noticeable bulge that is exacerbated during physical activity and bowel movements. She also reports associated nausea but no vomiting. The pain has been significant enough for consideration of an emergency room visit. Jean Vazquez previously ceased pantoprazole suspecting it as a cause of her stomach discomfort, but the pain persisted. The patient has tried self-medicating with baking soda for associated heartburn and has a history of smoking which she quit in 2012.  History reviewed. No pertinent surgical history.  Outpatient Medications Prior to Visit  Medication Sig Dispense Refill   aspirin 81 MG chewable tablet Chew 1 tablet (81 mg total) by mouth daily. 90 tablet 1   atorvastatin (LIPITOR) 40 MG tablet Take 1 tablet by mouth once daily 90 tablet 3   busPIRone (BUSPAR) 15 MG tablet Take 1 tablet by mouth twice daily 180 tablet 0   iron polysaccharides (  NIFEREX) 150 MG capsule Take 1 capsule (150 mg total) by mouth 2 (two)  times daily. 180 capsule 2   Omega-3 Fatty Acids (FISH OIL) 1000 MG CAPS Take by mouth.     pantoprazole (PROTONIX) 40 MG tablet Take 1 tablet by mouth once daily 90 tablet 3   sertraline (ZOLOFT) 100 MG tablet Take 2 tablets by mouth once daily 180 tablet 3   docusate sodium (COLACE) 100 MG capsule Take 1 capsule (100 mg total) by mouth 2 (two) times daily. With iron tablets (Patient not taking: Reported on 05/07/2022) 10 capsule 0   No facility-administered medications prior to visit.    Family History  Problem Relation Age of Onset   Heart disease Mother    Diabetes Mother    Cancer Father        Lung   Anxiety disorder Father    Diabetes Father    Breast cancer Maternal Aunt    Diabetes type I Daughter    Colon cancer Neg Hx    Colon polyps Neg Hx    Esophageal cancer Neg Hx    Stomach cancer Neg Hx    Rectal cancer Neg Hx     Social History   Socioeconomic History   Marital status: Married    Spouse name: Not on file   Number of children: Not on file   Years of education: Not on file   Highest education level: Not on file  Occupational History   Not on file  Tobacco Use   Smoking status: Former    Packs/day: 1.50    Years: 25.00    Total pack years: 37.50    Types: Cigarettes    Quit date: 2012    Years since quitting: 12.2    Passive exposure: Past   Smokeless tobacco: Never  Vaping Use   Vaping Use: Not on file  Substance and Sexual Activity   Alcohol use: No   Drug use: Never   Sexual activity: Not Currently    Comment: 1st intercourse 52yo-Fewer than 5 partners  Other Topics Concern   Not on file  Social History Narrative   Not on file   Social Determinants of Health   Financial Resource Strain: Not on file  Food Insecurity: Not on file  Transportation Needs: Not on file  Physical Activity: Not on file  Stress: Not on file  Social Connections: Not on file  Intimate Partner Violence: Not on file                                                                                                  Objective:  Physical Exam: BP 128/84 (BP Location: Left Arm, Patient Position: Sitting, Cuff Size: Large)   Pulse 78   Temp 97.8 F (36.6 C) (Temporal)   Wt 196 lb 3.2 oz (89 kg)   SpO2 98%   BMI 34.76 kg/m     Physical Exam Constitutional:      Appearance: Normal appearance.  HENT:     Head: Normocephalic and atraumatic.     Right Ear: Hearing normal.  Left Ear: Hearing normal.     Nose: Nose normal.  Eyes:     General: No scleral icterus.       Right eye: No discharge.        Left eye: No discharge.     Extraocular Movements: Extraocular movements intact.  Cardiovascular:     Comments: No cyanosis, no JVD Pulmonary:     Effort: Pulmonary effort is normal.     Comments: No auditory wheezing Abdominal:     Tenderness: There is abdominal tenderness in the periumbilical area.     Hernia: A hernia is present. Hernia is present in the umbilical area.  Musculoskeletal:     Comments: Normal Ambulation. No clubbing  Skin:    General: Skin is warm.     Findings: No rash.  Neurological:     General: No focal deficit present.     Mental Status: She is alert.     Cranial Nerves: No cranial nerve deficit.  Psychiatric:        Mood and Affect: Mood normal.        Behavior: Behavior normal.        Thought Content: Thought content normal.        Judgment: Judgment normal.    Results for orders placed or performed in visit on 05/07/22  POCT urine pregnancy  Result Value Ref Range   Preg Test, Ur Negative Negative         Alesia Banda, MD, MS

## 2022-05-07 NOTE — Patient Instructions (Signed)
We are referring to the CT scan and general surgery for evaluation for this hernia.   The office will call to coordinate, however please do watch out for.call  Go to ED if severe abdominal pain, vomiting or any other worrisome symptom

## 2022-05-15 ENCOUNTER — Other Ambulatory Visit: Payer: Self-pay

## 2022-05-15 ENCOUNTER — Emergency Department (HOSPITAL_COMMUNITY): Payer: Medicaid Other

## 2022-05-15 ENCOUNTER — Emergency Department (HOSPITAL_COMMUNITY)
Admission: EM | Admit: 2022-05-15 | Discharge: 2022-05-15 | Disposition: A | Discharge status: Home / Self Care | Payer: Medicaid Other | Attending: Emergency Medicine | Admitting: Emergency Medicine

## 2022-05-15 ENCOUNTER — Encounter (HOSPITAL_COMMUNITY): Payer: Self-pay

## 2022-05-15 DIAGNOSIS — Z7982 Long term (current) use of aspirin: Secondary | ICD-10-CM | POA: Diagnosis not present

## 2022-05-15 DIAGNOSIS — R109 Unspecified abdominal pain: Secondary | ICD-10-CM | POA: Diagnosis present

## 2022-05-15 DIAGNOSIS — K429 Umbilical hernia without obstruction or gangrene: Secondary | ICD-10-CM | POA: Diagnosis not present

## 2022-05-15 DIAGNOSIS — K439 Ventral hernia without obstruction or gangrene: Secondary | ICD-10-CM

## 2022-05-15 LAB — CBC WITH DIFFERENTIAL/PLATELET
Abs Immature Granulocytes: 0.02 10*3/uL (ref 0.00–0.07)
Basophils Absolute: 0 10*3/uL (ref 0.0–0.1)
Basophils Relative: 1 %
Eosinophils Absolute: 0.2 10*3/uL (ref 0.0–0.5)
Eosinophils Relative: 3 %
HCT: 42.7 % (ref 36.0–46.0)
Hemoglobin: 14.5 g/dL (ref 12.0–15.0)
Immature Granulocytes: 0 %
Lymphocytes Relative: 31 %
Lymphs Abs: 2.5 10*3/uL (ref 0.7–4.0)
MCH: 29.4 pg (ref 26.0–34.0)
MCHC: 34 g/dL (ref 30.0–36.0)
MCV: 86.6 fL (ref 80.0–100.0)
Monocytes Absolute: 0.7 10*3/uL (ref 0.1–1.0)
Monocytes Relative: 9 %
Neutro Abs: 4.6 10*3/uL (ref 1.7–7.7)
Neutrophils Relative %: 56 %
Platelets: 274 10*3/uL (ref 150–400)
RBC: 4.93 MIL/uL (ref 3.87–5.11)
RDW: 12.8 % (ref 11.5–15.5)
WBC: 8.1 10*3/uL (ref 4.0–10.5)
nRBC: 0 % (ref 0.0–0.2)

## 2022-05-15 LAB — COMPREHENSIVE METABOLIC PANEL
ALT: 22 U/L (ref 0–44)
AST: 18 U/L (ref 15–41)
Albumin: 3.9 g/dL (ref 3.5–5.0)
Alkaline Phosphatase: 56 U/L (ref 38–126)
Anion gap: 13 (ref 5–15)
BUN: 15 mg/dL (ref 6–20)
CO2: 22 mmol/L (ref 22–32)
Calcium: 9.9 mg/dL (ref 8.9–10.3)
Chloride: 102 mmol/L (ref 98–111)
Creatinine, Ser: 0.9 mg/dL (ref 0.44–1.00)
GFR, Estimated: >60 mL/min (ref >60)
Glucose, Bld: 93 mg/dL (ref 70–99)
Potassium: 3.9 mmol/L (ref 3.5–5.1)
Sodium: 137 mmol/L (ref 135–145)
Total Bilirubin: 0.3 mg/dL (ref 0.3–1.2)
Total Protein: 6.8 g/dL (ref 6.5–8.1)

## 2022-05-15 LAB — URINALYSIS, ROUTINE W REFLEX MICROSCOPIC
Bacteria, UA: NONE SEEN
Bilirubin Urine: NEGATIVE
Glucose, UA: NEGATIVE mg/dL
Hgb urine dipstick: NEGATIVE
Ketones, ur: NEGATIVE mg/dL
Leukocytes,Ua: NEGATIVE
Nitrite: NEGATIVE
Protein, ur: NEGATIVE mg/dL
Specific Gravity, Urine: 1.009 (ref 1.005–1.030)
pH: 5 (ref 5.0–8.0)

## 2022-05-15 LAB — LIPASE, BLOOD: Lipase: 30 U/L (ref 11–51)

## 2022-05-15 LAB — LACTIC ACID, PLASMA: Lactic Acid, Venous: 0.8 mmol/L (ref 0.5–1.9)

## 2022-05-15 LAB — PREGNANCY, URINE: Preg Test, Ur: NEGATIVE

## 2022-05-15 MED ORDER — ONDANSETRON 4 MG PO TBDP: 4.0000 mg | ORAL_TABLET | Freq: Three times a day (TID) | ORAL | 0 refills | Status: DC | PRN

## 2022-05-15 MED ORDER — IOHEXOL 350 MG/ML SOLN
75.0000 mL | Freq: Once | INTRAVENOUS | Status: AC | PRN
  Administered 2022-05-15: 75 mL via INTRAVENOUS

## 2022-05-15 MED ORDER — HYDROCODONE-ACETAMINOPHEN 5-325 MG PO TABS
1.0000 | ORAL_TABLET | Freq: Once | ORAL | Status: AC
  Administered 2022-05-15: 1 via ORAL
  Filled 2022-05-15: qty 1

## 2022-05-15 MED ORDER — ONDANSETRON 4 MG PO TBDP
4.0000 mg | ORAL_TABLET | Freq: Once | ORAL | Status: AC
  Administered 2022-05-15: 4 mg via ORAL
  Filled 2022-05-15: qty 1

## 2022-05-15 NOTE — ED Provider Notes (Signed)
[facility] Provider Note   CSN: [id number] Arrival date & time: 05/15/22  1748     History  Chief Complaint  Patient presents with   Abdominal Pain    Jean Vazquez is a 52 y.o. female with GERD, iron deficiency, menorrhagia, h/o umbilical hernia, obesity, A/D who presents with abd pain.    Pt complains of local hernia pain that began today when she woke up.  Patient states a week ago she was diagnosed with umbilical hernia and has a general surgery appointment beginning of April.  Patient states she has been nauseous all day and has had decreased food intake.  Patient states she has not had a bowel movement today and has had decreased bowel movements past few days and does not know if she is passing gas.   Patient denies chest pain, shortness of breath, overlying skin color changes, vomiting, dysuria   Abdominal Pain      Home Medications Prior to Admission medications   Medication Sig Start Date End Date Taking? Authorizing Provider  aspirin 81 MG chewable tablet Chew 1 tablet (81 mg total) by mouth daily. 05/22/19   Libby Maw, MD  atorvastatin (LIPITOR) 40 MG tablet Take 1 tablet by mouth once daily 06/19/21   Libby Maw, MD  busPIRone (BUSPAR) 15 MG tablet Take 1 tablet by mouth twice daily 03/05/22   Libby Maw, MD  clonazePAM (KLONOPIN) 0.5 MG tablet Take 1 tablet (0.5 mg total) by mouth once as needed for up to 1 dose for anxiety (before scan). 05/07/22   Bonnita Hollow, MD  docusate sodium (COLACE) 100 MG capsule Take 1 capsule (100 mg total) by mouth 2 (two) times daily. With iron tablets Patient not taking: Reported on 05/07/2022 11/24/19   Libby Maw, MD  iron polysaccharides (NIFEREX) 150 MG capsule Take 1 capsule (150 mg total) by mouth 2 (two) times daily. 11/27/21   Libby Maw, MD  Omega-3 Fatty Acids (FISH OIL) 1000 MG CAPS Take by mouth.    [provider]  pantoprazole (PROTONIX) 40 MG tablet Take 1 tablet by mouth once daily 06/19/21   Libby Maw, MD  sertraline (ZOLOFT) 100 MG tablet Take 2 tablets by mouth once daily 06/19/21   Libby Maw, MD      Allergies    Patient has no known allergies.    Review of Systems   Review of Systems  Gastrointestinal:  Positive for abdominal pain.   Review of systems Negative for f/c.  A 10 point review of systems was performed and is negative unless otherwise reported in HPI.  Physical Exam Updated Vital Signs Ht 5\' 3"  (1.6 m)   Wt 89 kg   LMP 03/12/2022 Comment: states did preg test at Dr, office 05/07/22 that was negative  BMI 34.76 kg/m  Physical Exam General: Normal appearing female, lying in bed.  HEENT: PERRLA, Sclera anicteric, MMM, trachea midline.  Cardiology: RRR, no murmurs/rubs/gallops. BL radial and DP pulses equal bilaterally.  Resp: Normal respiratory rate and effort. CTAB, no wheezes, rhonchi, crackles.  Abd: Soft, 3 x 3 cm umbilical hernia palpated which is the likely nidus of the pain giving rise to mild generalized abdominal pain.  Hernia is not erythematous.  Non-distended. No rebound tenderness or guarding.  GU: Deferred. MSK: No peripheral edema or signs of trauma. Extremities without deformity or TTP. No cyanosis or clubbing. Skin: warm, dry. No rashes or lesions. Back: No  CVA tenderness Neuro: A&Ox4, CNs II-XII grossly intact. MAEs. Sensation grossly intact.  Psych: Normal mood and affect.   ED Results / Procedures / Treatments   Labs (all labs ordered are listed, but only abnormal results are displayed) Labs Reviewed  CBC WITH DIFFERENTIAL/PLATELET  COMPREHENSIVE METABOLIC PANEL  LIPASE, BLOOD  URINALYSIS, ROUTINE W REFLEX MICROSCOPIC  PREGNANCY, URINE    EKG None  Radiology No results found.  Procedures Procedures    Medications Ordered in ED Medications  ondansetron (ZOFRAN-ODT) disintegrating tablet 4 mg (4 mg  Oral Given 05/15/22 1827)  HYDROcodone-acetaminophen (NORCO/VICODIN) 5-325 MG per tablet 1 tablet (1 tablet Oral Given 05/15/22 1827)    ED Course/ Medical Decision Making/ A&P                          Medical Decision Making Amount and/or Complexity of Data Reviewed Labs: ordered. Radiology:  Decision-making details documented in ED Course.  Risk Prescription drug management.    This patient presents to the ED for concern of abdominal pain around a hernia site, this involves an extensive number of treatment options, and is a complaint that carries with it a high risk of complications and morbidity.  I considered the following differential and admission for this acute, potentially life threatening condition.   MDM:    For DDX for abdominal pain includes but is not limited to:  Abdominal exam without peritoneal signs. No ]evidence of acute abdomen at this time.   Patient with central and generalized abdominal pain mostly concentrated around an umbilical hernia.  The hernia is hard to palpation.  Will scan belly to investigate for incarcerated or strangulated hernia prior to attempted reduction if possible.  Given the pain is mostly around the hernia I have overall low suspicion for acute hepatobiliary disease (including acute cholecystitis or cholangitis), acute pancreatitis (neg lipase), PUD (including gastric perforation), acute appendicitis, vascular catastrophe, bowel obstruction, or diverticulitis.    Clinical Course as of 05/31/22 1515  Tue May 15, 2022  2042 CBC, CMP, pregnancy, lipase, and UA wnl [HN]  2111 CT Abdomen Pelvis W Contrast IMPRESSION: Edematous/ischemic fat at the level of the patient's umbilical hernia, progressed from 8 days ago.   [HN]    Clinical Course User Index [HN] Audley Hose, MD    Labs: I Ordered, and personally interpreted labs.  The pertinent results include:  those listed above  Imaging Studies ordered: I ordered imaging studies  including CT abd pelvis I independently visualized and interpreted imaging. I agree with the radiologist interpretation  Additional history obtained from chart review, daughter at bedside.  Reevaluation: After the interventions noted above, I reevaluated the patient and found that they have :improved  Social Determinants of Health: Patient lives independently   Disposition: Pain lessened by hydrocodone given here.  Patient CT and labs are remarkable only for a fat-containing umbilical hernia.  There is ischemic fat within the hernia which is causing the patient's pain likely however she does not have any bowel in the hernia and does not require surgery at the time.  Will prescribe patient Zofran and ODT and instructed her to follow-up with surgery as an outpatient for elective hernia repair. DC w/ discharge instructions/return precautions. All questions answered to patient's satisfaction.    Co morbidities that complicate the patient evaluation  Past Medical History:  Diagnosis Date   Acid reflux    Anemia 2015   IDA   Anxiety    Depression  Hyperlipidemia    Urinary incontinence      Medicines Meds ordered this encounter  Medications   ondansetron (ZOFRAN-ODT) disintegrating tablet 4 mg   HYDROcodone-acetaminophen (NORCO/VICODIN) 5-325 MG per tablet 1 tablet    I have reviewed the patients home medicines and have made adjustments as needed  Problem List / ED Course: Problem List Items Addressed This Visit   None Visit Diagnoses     Hernia of abdominal wall    -  Primary                   This note was created using dictation software, which may contain spelling or grammatical errors.    Audley Hose, MD 05/31/22 [phone]

## 2022-05-15 NOTE — ED Provider Triage Note (Cosign Needed Addendum)
Emergency Medicine Provider Triage Evaluation Note  Jean Vazquez , a 52 y.o. female  was evaluated in triage.  Pt complains of local hernia pain that began today when she woke up.  Patient states a week ago she was diagnosed with umbilical hernia and has a general surgery appointment beginning of April.  Patient states she has been nauseous all day and has had decreased food intake.  Patient states she has not had a bowel movement today and has had decreased bowel movements past few days and does not know if she is passing gas.  Patient denies chest pain, shortness of breath, overlying skin color changes, vomiting, dysuria  Review of Systems  Positive: See HPI Negative: See HPI  Physical Exam  LMP 03/12/2022 Comment: states did preg test at Dr, office 05/07/22 that was negative Gen:   Awake, no distress   Resp:  Normal effort  MSK:   Moves extremities without difficulty  Other:  Umbilical hernia palpated and reducible however popped back out, no overlying skin color changes, abdomen soft with no peritoneal signs, face and neck very erythematous without tenderness or symptoms  Medical Decision Making  Medically screening exam initiated at 6:14 PM.  Appropriate orders placed.  Jean Vazquez was informed that the remainder of the evaluation will be completed by another provider, this initial triage assessment does not replace that evaluation, and the importance of remaining in the ED until their evaluation is complete.  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

## 2022-05-15 NOTE — Discharge Instructions (Addendum)
Thank you for coming to [facility] Emergency Department. You were seen for abdominal pain. We did an exam, labs, and imaging, and these showed a fat-containing abdominal wall hernia.  Please follow up with your surgeon on 4/3 or earlier about repair. You can take zofran every 6-8 hours under the tongue as needed for nausea/vomiting. Stay well hydrated. You can alternate taking Tylenol and ibuprofen as needed for pain. You can take 650mg  tylenol (acetaminophen) every 4-6 hours, and 600 mg ibuprofen 3 times a day.    Do not hesitate to return to the ED or call 911 if you experience: -Worsening symptoms -Inability to eat or drink due to severe nausea vomiting -Severe worsening abdominal pain -Lightheadedness, passing out -Fevers/chills -Anything else that concerns you

## 2022-05-15 NOTE — ED Triage Notes (Signed)
Pt c/o pain from mid abdominal hernia started today. Pt states last BM was yesterday. Pt states she doesn't think she has passed gas today. Pt c/o nausea, but denies vomiting.

## 2022-06-01 ENCOUNTER — Other Ambulatory Visit: Payer: Self-pay | Admitting: Family Medicine

## 2022-06-01 ENCOUNTER — Ambulatory Visit: Payer: Commercial Managed Care - HMO | Admitting: Family Medicine

## 2022-06-01 DIAGNOSIS — K219 Gastro-esophageal reflux disease without esophagitis: Secondary | ICD-10-CM

## 2022-06-01 DIAGNOSIS — E7800 Pure hypercholesterolemia, unspecified: Secondary | ICD-10-CM

## 2022-06-01 DIAGNOSIS — F418 Other specified anxiety disorders: Secondary | ICD-10-CM

## 2022-06-04 ENCOUNTER — Ambulatory Visit: Payer: Commercial Managed Care - HMO | Admitting: Family Medicine

## 2022-06-05 ENCOUNTER — Telehealth: Payer: Self-pay | Admitting: Family Medicine

## 2022-06-05 ENCOUNTER — Encounter: Payer: Self-pay | Admitting: Family Medicine

## 2022-06-05 NOTE — Telephone Encounter (Signed)
ERROR

## 2022-06-05 NOTE — Telephone Encounter (Signed)
Pt called and wanted to know if you can find her another referral for her hernia because the one office you referred her to do not accept her insurance

## 2022-06-06 NOTE — Telephone Encounter (Signed)
Spoke with Sherri with referrals to see if there was anywhere else patient could be sent to per Sherri patient will need to reach out to her insurance to see who's in their network. Called patient to inform if message from [facility] no answer, left detailed message informing patient to call insurance and give [place] a call back with who insurance will cover.

## 2022-08-17 ENCOUNTER — Other Ambulatory Visit: Payer: Self-pay | Admitting: Family Medicine

## 2022-08-17 ENCOUNTER — Ambulatory Visit (INDEPENDENT_AMBULATORY_CARE_PROVIDER_SITE_OTHER): Payer: Medicaid Other | Admitting: Family Medicine

## 2022-08-17 ENCOUNTER — Encounter: Payer: Self-pay | Admitting: Family Medicine

## 2022-08-17 ENCOUNTER — Telehealth: Payer: Self-pay | Admitting: Family Medicine

## 2022-08-17 VITALS — BP 124/80 | HR 76 | Temp 97.7°F | Wt 192.6 lb

## 2022-08-17 DIAGNOSIS — K219 Gastro-esophageal reflux disease without esophagitis: Secondary | ICD-10-CM | POA: Diagnosis not present

## 2022-08-17 DIAGNOSIS — Z6834 Body mass index (BMI) 34.0-34.9, adult: Secondary | ICD-10-CM | POA: Diagnosis not present

## 2022-08-17 DIAGNOSIS — E7800 Pure hypercholesterolemia, unspecified: Secondary | ICD-10-CM | POA: Diagnosis not present

## 2022-08-17 DIAGNOSIS — N939 Abnormal uterine and vaginal bleeding, unspecified: Secondary | ICD-10-CM

## 2022-08-17 DIAGNOSIS — R7401 Elevation of levels of liver transaminase levels: Secondary | ICD-10-CM | POA: Diagnosis not present

## 2022-08-17 DIAGNOSIS — F331 Major depressive disorder, recurrent, moderate: Secondary | ICD-10-CM | POA: Diagnosis not present

## 2022-08-17 DIAGNOSIS — K429 Umbilical hernia without obstruction or gangrene: Secondary | ICD-10-CM

## 2022-08-17 DIAGNOSIS — D500 Iron deficiency anemia secondary to blood loss (chronic): Secondary | ICD-10-CM | POA: Diagnosis not present

## 2022-08-17 DIAGNOSIS — R519 Headache, unspecified: Secondary | ICD-10-CM | POA: Diagnosis not present

## 2022-08-17 DIAGNOSIS — Z122 Encounter for screening for malignant neoplasm of respiratory organs: Secondary | ICD-10-CM | POA: Diagnosis not present

## 2022-08-17 DIAGNOSIS — Z1159 Encounter for screening for other viral diseases: Secondary | ICD-10-CM

## 2022-08-17 DIAGNOSIS — F419 Anxiety disorder, unspecified: Secondary | ICD-10-CM

## 2022-08-17 DIAGNOSIS — E669 Obesity, unspecified: Secondary | ICD-10-CM | POA: Diagnosis not present

## 2022-08-17 DIAGNOSIS — E611 Iron deficiency: Secondary | ICD-10-CM

## 2022-08-17 DIAGNOSIS — E66811 Obesity, class 1: Secondary | ICD-10-CM

## 2022-08-17 LAB — CBC WITH DIFFERENTIAL/PLATELET
Basophils Absolute: 0 10*3/uL (ref 0.0–0.1)
Basophils Relative: 0.8 % (ref 0.0–3.0)
Eosinophils Absolute: 0.1 10*3/uL (ref 0.0–0.7)
Eosinophils Relative: 2.1 % (ref 0.0–5.0)
HCT: 38.1 % (ref 36.0–46.0)
Hemoglobin: 12.5 g/dL (ref 12.0–15.0)
Lymphocytes Relative: 32.1 % (ref 12.0–46.0)
Lymphs Abs: 1.6 10*3/uL (ref 0.7–4.0)
MCHC: 32.8 g/dL (ref 30.0–36.0)
MCV: 85.8 fl (ref 78.0–100.0)
Monocytes Absolute: 0.5 10*3/uL (ref 0.1–1.0)
Monocytes Relative: 10.6 % (ref 3.0–12.0)
Neutro Abs: 2.7 10*3/uL (ref 1.4–7.7)
Neutrophils Relative %: 54.4 % (ref 43.0–77.0)
Platelets: 193 10*3/uL (ref 150.0–400.0)
RBC: 4.45 Mil/uL (ref 3.87–5.11)
RDW: 13.8 % (ref 11.5–15.5)
WBC: 4.9 10*3/uL (ref 4.0–10.5)

## 2022-08-17 LAB — MICROALBUMIN / CREATININE URINE RATIO
Creatinine,U: 20.3 mg/dL
Microalb Creat Ratio: 3.5 mg/g (ref 0.0–30.0)
Microalb, Ur: <0.7 mg/dL (ref 0.0–1.9)

## 2022-08-17 LAB — COMPREHENSIVE METABOLIC PANEL
ALT: 55 U/L — ABNORMAL HIGH (ref 0–35)
AST: 36 U/L (ref 0–37)
Albumin: 4 g/dL (ref 3.5–5.2)
Alkaline Phosphatase: 68 U/L (ref 39–117)
BUN: 11 mg/dL (ref 6–23)
CO2: 24 mEq/L (ref 19–32)
Calcium: 9.4 mg/dL (ref 8.4–10.5)
Chloride: 104 mEq/L (ref 96–112)
Creatinine, Ser: 0.81 mg/dL (ref 0.40–1.20)
GFR: 84.12 mL/min (ref >60.00)
Glucose, Bld: 99 mg/dL (ref 70–99)
Potassium: 4.3 mEq/L (ref 3.5–5.1)
Sodium: 137 mEq/L (ref 135–145)
Total Bilirubin: 0.5 mg/dL (ref 0.2–1.2)
Total Protein: 6.8 g/dL (ref 6.0–8.3)

## 2022-08-17 LAB — URINALYSIS, ROUTINE W REFLEX MICROSCOPIC
Bilirubin Urine: NEGATIVE
Hgb urine dipstick: NEGATIVE
Ketones, ur: NEGATIVE
Leukocytes,Ua: NEGATIVE
Nitrite: NEGATIVE
Specific Gravity, Urine: <=1.005 — AB (ref 1.000–1.030)
Total Protein, Urine: NEGATIVE
Urine Glucose: NEGATIVE
Urobilinogen, UA: 0.2 (ref 0.0–1.0)
pH: 6.5 (ref 5.0–8.0)

## 2022-08-17 LAB — LIPID PANEL
Cholesterol: 134 mg/dL (ref 0–200)
HDL: 41.4 mg/dL (ref >39.00)
LDL Cholesterol: 63 mg/dL (ref 0–99)
NonHDL: 92.25
Total CHOL/HDL Ratio: 3
Triglycerides: 148 mg/dL (ref 0.0–149.0)
VLDL: 29.6 mg/dL (ref 0.0–40.0)

## 2022-08-17 LAB — FOLATE: Folate: 12.2 ng/mL (ref >5.9)

## 2022-08-17 LAB — VITAMIN B12: Vitamin B-12: 366 pg/mL (ref 211–911)

## 2022-08-17 LAB — FERRITIN: Ferritin: 26.8 ng/mL (ref 10.0–291.0)

## 2022-08-17 LAB — HEMOGLOBIN A1C: Hgb A1c MFr Bld: 5.6 % (ref 4.6–6.5)

## 2022-08-17 LAB — TSH: TSH: 1.1 u[IU]/mL (ref 0.35–5.50)

## 2022-08-17 MED ORDER — SUMATRIPTAN SUCCINATE 50 MG PO TABS: 50.0000 mg | ORAL_TABLET | ORAL | 0 refills | Status: DC | PRN

## 2022-08-17 MED ORDER — PANTOPRAZOLE SODIUM 40 MG PO TBEC: 40.0000 mg | DELAYED_RELEASE_TABLET | Freq: Every day | ORAL | 3 refills | Status: DC

## 2022-08-17 MED ORDER — SERTRALINE HCL 100 MG PO TABS: 200.0000 mg | ORAL_TABLET | Freq: Every day | ORAL | 3 refills | Status: DC

## 2022-08-17 MED ORDER — BUSPIRONE HCL 15 MG PO TABS: 15.0000 mg | ORAL_TABLET | Freq: Two times a day (BID) | ORAL | 3 refills | Status: DC

## 2022-08-17 MED ORDER — POLYSACCHARIDE IRON COMPLEX 150 MG PO CAPS: 150.0000 mg | ORAL_CAPSULE | Freq: Two times a day (BID) | ORAL | 3 refills | Status: DC

## 2022-08-17 MED ORDER — ATORVASTATIN CALCIUM 40 MG PO TABS: 40.0000 mg | ORAL_TABLET | Freq: Every day | ORAL | 3 refills | Status: DC

## 2022-08-17 NOTE — Assessment & Plan Note (Signed)
Continue sertraline 200 mg daily and buspirone 15 mg twice daily. Declined clonazepam due to dependency risks, referral to psychiatry for further management.

## 2022-08-17 NOTE — Assessment & Plan Note (Signed)
Continue atorvastatin 40 mg daily. Repeat lipid panel.

## 2022-08-17 NOTE — Assessment & Plan Note (Signed)
Continue pantoprazole 40 mg daily.  ?

## 2022-08-17 NOTE — Addendum Note (Signed)
Addended by: Garnette Gunner on: 08/17/2022 06:36 PM   Modules accepted: Orders

## 2022-08-17 NOTE — Assessment & Plan Note (Signed)
Follow-up with the [facility]. Monitor symptoms and seek emergency care if severe pain occurs.

## 2022-08-17 NOTE — Assessment & Plan Note (Signed)
Chronic blood loss related to menorrhagia. Plan: Continue iron polysaccharide 150 mg twice daily. Monitor iron levels and labs.

## 2022-08-17 NOTE — Telephone Encounter (Signed)
Pt was seen today 08/17/22 by Dr Janee Morn and says they spoke about her getting a script for her migraines. She would like to know if she can now get this.   [facility] [address], [place] - [address] [place] [address] Phone: [phone]  Fax: [phone]  Pt at [phone]

## 2022-08-17 NOTE — Patient Instructions (Addendum)
Umbilical Hernia  [facility] [address] [place], [place] [address] [phone]  Follow up with the [facility] to confirm your referral and schedule the hernia surgery. Call them using the information provided. Stop aspirin.  Continue taking your other current medications, including Sertraline 200 mg, Buspar 15 mg twice a day, atorvastatin 40 mg, and iron polysaccharide 150 mg twice a day.  Complete the blood work and urine tests as discussed, including cholesterol, blood sugar, electrolytes, and thyroid function tests. This is to monitor your overall health condition.  Schedule an appointment for a lung cancer screening following the referral. You will receive a call with more information.  Follow up if headache does not get better

## 2022-08-17 NOTE — Assessment & Plan Note (Signed)
Headaches likely secondary to stress/anxiety.  Differential Diagnosis: Migraine. Tension headache.  Plan: Continue ibuprofen as needed. Consider migraine-specific medication if headaches persist or worsen.

## 2022-08-17 NOTE — Progress Notes (Signed)
Assessment/Plan:   Problem List Items Addressed This Visit       Digestive   Gastroesophageal reflux disease    Continue pantoprazole 40 mg daily.      Relevant Medications   pantoprazole (PROTONIX) 40 MG tablet     Other   Depression with anxiety    Continue sertraline 200 mg daily and buspirone 15 mg twice daily. Declined clonazepam due to dependency risks, referral to psychiatry for further management.      Relevant Medications   busPIRone (BUSPAR) 15 MG tablet   sertraline (ZOLOFT) 100 MG tablet   Pure hypercholesterolemia    Continue atorvastatin 40 mg daily. Repeat lipid panel.      Relevant Medications   atorvastatin (LIPITOR) 40 MG tablet   Other Relevant Orders   TSH   Lipid panel   Hemoglobin A1c   Microalbumin / creatinine urine ratio   Urinalysis, Routine w reflex microscopic   Comprehensive metabolic panel   Iron deficiency anemia due to chronic blood loss    Chronic blood loss related to menorrhagia. Plan: Continue iron polysaccharide 150 mg twice daily. Monitor iron levels and labs.      Relevant Medications   iron polysaccharides (NIFEREX) 150 MG capsule   Other Relevant Orders   CBC with Differential/Platelet   Vitamin B12   Folate   Iron and TIBC   Ferritin   Anxiety - Primary   Relevant Medications   busPIRone (BUSPAR) 15 MG tablet   sertraline (ZOLOFT) 100 MG tablet   Other Relevant Orders   Vitamin D 1,25 dihydroxy   Ambulatory referral to Psychiatry   Morbid obesity (HCC)   Umbilical hernia    Follow-up with the [facility]. Monitor symptoms and seek emergency care if severe pain occurs.      Nonintractable episodic headache    Headaches likely secondary to stress/anxiety.  Differential Diagnosis: Migraine. Tension headache.  Plan: Continue ibuprofen as needed. Consider migraine-specific medication if headaches persist or worsen.      Relevant Medications   sertraline (ZOLOFT) 100 MG tablet    Other Visit Diagnoses     Screening for lung cancer       Relevant Orders   Ambulatory Referral Lung Cancer Screening [facility]   Screening for viral disease       Elevated cholesterol       Relevant Medications   atorvastatin (LIPITOR) 40 MG tablet   Iron deficiency       Relevant Medications   iron polysaccharides (NIFEREX) 150 MG capsule   Abnormal uterine bleeding (AUB)       Relevant Orders   Ambulatory referral to Obstetrics / Gynecology       Medications Discontinued During This Encounter  Medication Reason   clonazePAM (KLONOPIN) 0.5 MG tablet    aspirin 81 MG chewable tablet    docusate sodium (COLACE) 100 MG capsule    ondansetron (ZOFRAN-ODT) 4 MG disintegrating tablet    iron polysaccharides (NIFEREX) 150 MG capsule Reorder   busPIRone (BUSPAR) 15 MG tablet Reorder   atorvastatin (LIPITOR) 40 MG tablet Reorder   pantoprazole (PROTONIX) 40 MG tablet Reorder   sertraline (ZOLOFT) 100 MG tablet Reorder    Return in about 6 months (around 02/16/2023) for follow up.    Subjective:   Encounter date: 08/17/2022  Jean Vazquez is a 52 y.o. female who has Screen for colon cancer; Achilles tendinitis of right lower extremity; Depression with anxiety; Gastroesophageal reflux disease; Pure hypercholesterolemia; Epigastric pain; Drug-induced  constipation; Stress incontinence; Menorrhagia with regular cycle; Medication side effect; Leg pain, lateral, right; Tendonitis of right peroneus longus tendon; Microcytic anemia; Elevated glucose; Need for influenza vaccination; Adjustment disorder with anxiety; Iron deficiency anemia due to chronic blood loss; Anxiety; Need for shingles vaccine; Hematuria; Morbid obesity (HCC); Umbilical hernia; and Nonintractable episodic headache on their problem list..   She  has a past medical history of Acid reflux, Anemia (2015), Anxiety, Depression, Hyperlipidemia, and Urinary incontinence..   Chief Complaint: Medical Management  of Chronic Issues: Lipid and iron labwork, Rx refill for all medications.  History of Present Illness:  1. Umbilical Hernia: The patient has an umbilical hernia that has previously required emergency room visits due to severe pain. The patient had an appointment scheduled with a surgeon but it was canceled because the facility did not accept her insurance. Referral was moved to [facility]. The patient is currently not experiencing severe symptoms but mentions occasional soreness.  2. Headaches and Back Pain: The patient reports experiencing headaches almost all week and a pushing sensation in her back that she attributes to stress and anxiety. The headaches are accompanied by nausea and are reminiscent of past anxiety-related symptoms. The patient is currently taking ibuprofen for headache relief.  3. Anxiety and Depression: The patient has a history of anxiety diagnosed since 2016 and depression, currently managed with sertraline 200 mg tablets daily and buspirone 15 mg twice a day. The patient expresses concerns about the difficulty swallowing buspirone tablets but prefers to continue the current regimen. She also inquires about clonazepam for occasional use but is advised against benzodiazepines due to dependency risks.  4. Iron Deficiency Anemia: The patient is continuing iron polysaccharide 150 mg twice a day for iron deficiency anemia due to chronic blood loss, related to irregular menstrual cycles. Recent labs indicate the anemia had resolved as of March.  5. Hypercholesterolemia: The patient is on atorvastatin 40 mg tablets daily for high cholesterol. Last cholesterol lab from August of the previous year showed elevated LDL at 86.  6. GERD: The patient is taking pantoprazole 40 mg daily for gastroesophageal reflux disease.  Review of Systems  Constitutional:  Negative for chills, diaphoresis, fever, malaise/fatigue and weight loss.  HENT:  Negative for congestion, ear  discharge, ear pain and hearing loss.   Eyes:  Negative for blurred vision, double vision, photophobia, pain, discharge and redness.  Respiratory:  Negative for cough, sputum production, shortness of breath and wheezing.   Cardiovascular:  Negative for chest pain and palpitations.  Gastrointestinal:  Positive for abdominal pain, heartburn and nausea. Negative for blood in stool, constipation, diarrhea, melena and vomiting.  Genitourinary:  Negative for dysuria, flank pain, frequency, hematuria and urgency.  Musculoskeletal:  Positive for back pain. Negative for myalgias.  Skin:  Negative for itching and rash.  Neurological:  Positive for dizziness. Negative for tingling, tremors, speech change, seizures, loss of consciousness, weakness and headaches.  Psychiatric/Behavioral:  Positive for depression. Negative for hallucinations, memory loss, substance abuse and suicidal ideas. The patient is nervous/anxious. The patient does not have insomnia.   All other systems reviewed and are negative.      08/17/2022   10:31 AM 05/07/2022    9:39 AM 11/24/2021    8:58 AM 05/26/2021   10:04 AM 05/26/2021    9:09 AM  Depression screen PHQ 2/9  Decreased Interest 0 0 0 0 0  Down, Depressed, Hopeless 0 0 0 0 0  PHQ - 2 Score 0 0 0  0 0  Altered sleeping 1 0  0   Tired, decreased energy 3 1  1    Change in appetite 3 3  3    Feeling bad or failure about yourself  0 0  0   Trouble concentrating 2 3  3    Moving slowly or fidgety/restless 1 0  1   Suicidal thoughts 0 0  0   PHQ-9 Score 10 7  8    Difficult doing work/chores Somewhat difficult Not difficult at all  Somewhat difficult       05/07/2022    9:39 AM 05/26/2021   10:05 AM 05/24/2020   10:47 AM 05/20/2019    4:21 PM  GAD 7 : Generalized Anxiety Score  Nervous, Anxious, on Edge 0 2 3 3   Control/stop worrying 0 1 2 1   Worry too much - different things 0 1 3 1   Trouble relaxing 0 3 3 3   Restless 0 3 3 3   Easily annoyed or irritable 0 2 1 1    Afraid - awful might happen 0 2 2 3   Total GAD 7 Score 0 14 17 15   Anxiety Difficulty Not difficult at all Somewhat difficult Somewhat difficult Somewhat difficult     No past surgical history on file.  Outpatient Medications Prior to Visit  Medication Sig Dispense Refill   Omega-3 Fatty Acids (FISH OIL) 1000 MG CAPS Take by mouth.     aspirin 81 MG chewable tablet Chew 1 tablet (81 mg total) by mouth daily. 90 tablet 1   atorvastatin (LIPITOR) 40 MG tablet Take 1 tablet by mouth once daily 90 tablet 0   busPIRone (BUSPAR) 15 MG tablet Take 1 tablet by mouth twice daily 180 tablet 0   iron polysaccharides (NIFEREX) 150 MG capsule Take 1 capsule (150 mg total) by mouth 2 (two) times daily. 180 capsule 2   pantoprazole (PROTONIX) 40 MG tablet Take 1 tablet by mouth once daily 90 tablet 0   sertraline (ZOLOFT) 100 MG tablet Take 2 tablets by mouth once daily 180 tablet 0   clonazePAM (KLONOPIN) 0.5 MG tablet Take 1 tablet (0.5 mg total) by mouth once as needed for up to 1 dose for anxiety (before scan). (Patient not taking: Reported on 08/17/2022) 1 tablet 0   docusate sodium (COLACE) 100 MG capsule Take 1 capsule (100 mg total) by mouth 2 (two) times daily. With iron tablets (Patient not taking: Reported on 05/07/2022) 10 capsule 0   ondansetron (ZOFRAN-ODT) 4 MG disintegrating tablet Take 1 tablet (4 mg total) by mouth every 8 (eight) hours as needed for nausea or vomiting. 20 tablet 0   No facility-administered medications prior to visit.    Family History  Problem Relation Age of Onset   Heart disease Mother    Diabetes Mother    Cancer Father        Lung   Anxiety disorder Father    Diabetes Father    Breast cancer Maternal Aunt    Diabetes type I Daughter    Colon cancer Neg Hx    Colon polyps Neg Hx    Esophageal cancer Neg Hx    Stomach cancer Neg Hx    Rectal cancer Neg Hx     Social History   Socioeconomic History   Marital status: Married    Spouse name: Not on  file   Number of children: Not on file   Years of education: Not on file   Highest education level: 12th grade  Occupational History  Not on file  Tobacco Use   Smoking status: Former    Packs/day: 1.50    Years: 25.00    Additional pack years: 0.00    Total pack years: 37.50    Types: Cigarettes    Quit date: 2012    Years since quitting: 12.4    Passive exposure: Past   Smokeless tobacco: Never  Vaping Use   Vaping Use: Not on file  Substance and Sexual Activity   Alcohol use: No   Drug use: Never   Sexual activity: Not Currently    Comment: 1st intercourse 52yo-Fewer than 5 partners  Other Topics Concern   Not on file  Social History Narrative   Not on file   Social Determinants of Health   Financial Resource Strain: Medium Risk (08/15/2022)   Overall Financial Resource Strain (CARDIA)    Difficulty of Paying Living Expenses: Somewhat hard  Food Insecurity: Food Insecurity Present (08/15/2022)   Hunger Vital Sign    Worried About Running Out of Food in the Last Year: Sometimes true    Ran Out of Food in the Last Year: Sometimes true  Transportation Needs: No Transportation Needs (08/15/2022)   PRAPARE - Administrator, Civil Service (Medical): No    Lack of Transportation (Non-Medical): No  Physical Activity: Insufficiently Active (08/15/2022)   Exercise Vital Sign    Days of Exercise per Week: 1 day    Minutes of Exercise per Session: 20 min  Stress: Stress Concern Present (08/15/2022)   [employer] of Occupational Health - Occupational Stress Questionnaire    Feeling of Stress : Rather much  Social Connections: Socially Integrated (08/15/2022)   Social Connection and Isolation Panel [NHANES]    Frequency of Communication with Friends and Family: Twice a week    Frequency of Social Gatherings with Friends and Family: Once a week    Attends Religious Services: More than 4 times per year    Active Member of [employer] or Organizations: Yes    Attends  Engineer, structural: More than 4 times per year    Marital Status: Married  Catering manager Violence: Not on file                                                                                                  Objective:  Physical Exam: BP 124/80 (BP Location: Left Arm, Patient Position: Sitting, Cuff Size: Large)   Pulse 76   Temp 97.7 F (36.5 C) (Temporal)   Wt 192 lb 9.6 oz (87.4 kg)   LMP 08/12/2022   SpO2 99%   BMI 34.12 kg/m   Wt Readings from Last 3 Encounters:  08/17/22 192 lb 9.6 oz (87.4 kg)  05/15/22 196 lb 3.4 oz (89 kg)  05/07/22 196 lb 3.2 oz (89 kg)     Physical Exam Constitutional:      Appearance: Normal appearance.  HENT:     Head: Normocephalic and atraumatic.     Right Ear: Hearing normal.     Left Ear: Hearing normal.     Nose: Nose normal.  Eyes:  General: No scleral icterus.       Right eye: No discharge.        Left eye: No discharge.     Extraocular Movements: Extraocular movements intact.  Cardiovascular:     Comments: No cyanosis, no JVD Pulmonary:     Effort: Pulmonary effort is normal.     Comments: No auditory wheezing Abdominal:     Tenderness: There is no abdominal tenderness.     Hernia: A hernia is present. Hernia is present in the umbilical area.  Musculoskeletal:     Comments: Normal Ambulation. No clubbing  Skin:    General: Skin is warm.     Findings: No rash.  Neurological:     General: No focal deficit present.     Mental Status: She is alert.     Cranial Nerves: No cranial nerve deficit.  Psychiatric:        Mood and Affect: Mood normal.        Behavior: Behavior normal.        Thought Content: Thought content normal.        Judgment: Judgment normal.     No results found.  No results found for this or any previous visit (from the past 2160 hour(s)).      Garner Nash, MD, MS

## 2022-08-20 LAB — HEPATITIS B SURFACE ANTIGEN: Hepatitis B Surface Ag: NONREACTIVE

## 2022-08-20 LAB — HCV AB W REFLEX TO QUANT PCR: HCV Ab: NONREACTIVE

## 2022-08-20 LAB — VITAMIN D 1,25 DIHYDROXY
Vitamin D 1, 25 (OH)2 Total: 60 pg/mL (ref 18–72)
Vitamin D2 1, 25 (OH)2: <8 pg/mL
Vitamin D3 1, 25 (OH)2: 60 pg/mL

## 2022-08-20 LAB — IRON AND TIBC
Iron Saturation: 32 % (ref 15–55)
Iron: 114 ug/dL (ref 27–159)
Total Iron Binding Capacity: 354 ug/dL (ref 250–450)
UIBC: 240 ug/dL (ref 131–425)

## 2022-08-20 LAB — HCV INTERPRETATION

## 2022-08-20 LAB — HIV ANTIBODY (ROUTINE TESTING W REFLEX): HIV 1&2 Ab, 4th Generation: NONREACTIVE

## 2022-08-29 ENCOUNTER — Other Ambulatory Visit: Payer: Medicaid Other

## 2022-09-12 ENCOUNTER — Other Ambulatory Visit (INDEPENDENT_AMBULATORY_CARE_PROVIDER_SITE_OTHER): Payer: Medicaid Other

## 2022-09-12 DIAGNOSIS — R7401 Elevation of levels of liver transaminase levels: Secondary | ICD-10-CM

## 2022-09-12 LAB — HEPATIC FUNCTION PANEL
ALT: 38 U/L — ABNORMAL HIGH (ref 0–35)
AST: 27 U/L (ref 0–37)
Albumin: 3.8 g/dL (ref 3.5–5.2)
Alkaline Phosphatase: 63 U/L (ref 39–117)
Bilirubin, Direct: 0.1 mg/dL (ref 0.0–0.3)
Total Bilirubin: 0.4 mg/dL (ref 0.2–1.2)
Total Protein: 6.5 g/dL (ref 6.0–8.3)

## 2022-09-27 DIAGNOSIS — Z79899 Other long term (current) drug therapy: Secondary | ICD-10-CM | POA: Diagnosis not present

## 2022-10-16 DIAGNOSIS — F4312 Post-traumatic stress disorder, chronic: Secondary | ICD-10-CM | POA: Diagnosis not present

## 2022-10-16 DIAGNOSIS — F410 Panic disorder [episodic paroxysmal anxiety] without agoraphobia: Secondary | ICD-10-CM | POA: Diagnosis not present

## 2022-10-18 ENCOUNTER — Other Ambulatory Visit: Payer: Self-pay | Admitting: Family Medicine

## 2022-10-18 DIAGNOSIS — R519 Headache, unspecified: Secondary | ICD-10-CM

## 2022-10-18 MED ORDER — SUMATRIPTAN SUCCINATE 50 MG PO TABS
50.0000 mg | ORAL_TABLET | ORAL | 0 refills | Status: DC | PRN
Start: 2022-10-18 — End: 2022-12-26

## 2022-12-12 DIAGNOSIS — F411 Generalized anxiety disorder: Secondary | ICD-10-CM | POA: Diagnosis not present

## 2022-12-12 DIAGNOSIS — F410 Panic disorder [episodic paroxysmal anxiety] without agoraphobia: Secondary | ICD-10-CM | POA: Diagnosis not present

## 2022-12-12 DIAGNOSIS — Z1331 Encounter for screening for depression: Secondary | ICD-10-CM | POA: Diagnosis not present

## 2022-12-12 DIAGNOSIS — F4312 Post-traumatic stress disorder, chronic: Secondary | ICD-10-CM | POA: Diagnosis not present

## 2022-12-26 ENCOUNTER — Other Ambulatory Visit: Payer: Self-pay

## 2022-12-26 DIAGNOSIS — R519 Headache, unspecified: Secondary | ICD-10-CM

## 2022-12-26 MED ORDER — SUMATRIPTAN SUCCINATE 50 MG PO TABS
50.0000 mg | ORAL_TABLET | ORAL | 0 refills | Status: DC | PRN
Start: 2022-12-26 — End: 2023-02-22

## 2023-01-17 DIAGNOSIS — K429 Umbilical hernia without obstruction or gangrene: Secondary | ICD-10-CM | POA: Diagnosis not present

## 2023-01-17 DIAGNOSIS — E66811 Obesity, class 1: Secondary | ICD-10-CM | POA: Diagnosis not present

## 2023-02-14 DIAGNOSIS — Z1231 Encounter for screening mammogram for malignant neoplasm of breast: Secondary | ICD-10-CM | POA: Diagnosis not present

## 2023-02-14 LAB — HM MAMMOGRAPHY

## 2023-02-18 ENCOUNTER — Ambulatory Visit: Payer: Self-pay | Admitting: Family Medicine

## 2023-02-21 ENCOUNTER — Telehealth: Payer: Self-pay

## 2023-02-21 NOTE — Telephone Encounter (Signed)
Copied from CRM [id number]. Topic: General - Other >> Feb 21, 2023  3:28 PM Florestine Avers wrote: Reason for CRM: Patient called requesting to Dr. Carollee Massed nurse, pt is awaiting phone call.

## 2023-02-21 NOTE — Telephone Encounter (Signed)
Contacted patient and she stated that she was calling to reschedule her 6 month follow up and refill her prescriptions for 90 days. Patient scheduled on 02/22/23 with Worthy Rancher, FNP.

## 2023-02-22 ENCOUNTER — Ambulatory Visit (INDEPENDENT_AMBULATORY_CARE_PROVIDER_SITE_OTHER): Payer: 59 | Admitting: Family

## 2023-02-22 ENCOUNTER — Encounter: Payer: Self-pay | Admitting: Family

## 2023-02-22 VITALS — BP 126/82 | HR 84 | Temp 97.6°F | Wt 197.4 lb

## 2023-02-22 DIAGNOSIS — E559 Vitamin D deficiency, unspecified: Secondary | ICD-10-CM

## 2023-02-22 DIAGNOSIS — Z23 Encounter for immunization: Secondary | ICD-10-CM

## 2023-02-22 DIAGNOSIS — E611 Iron deficiency: Secondary | ICD-10-CM

## 2023-02-22 DIAGNOSIS — F331 Major depressive disorder, recurrent, moderate: Secondary | ICD-10-CM

## 2023-02-22 DIAGNOSIS — R7309 Other abnormal glucose: Secondary | ICD-10-CM

## 2023-02-22 DIAGNOSIS — E7800 Pure hypercholesterolemia, unspecified: Secondary | ICD-10-CM | POA: Diagnosis not present

## 2023-02-22 DIAGNOSIS — R519 Headache, unspecified: Secondary | ICD-10-CM

## 2023-02-22 DIAGNOSIS — K219 Gastro-esophageal reflux disease without esophagitis: Secondary | ICD-10-CM

## 2023-02-22 LAB — CBC WITH DIFFERENTIAL/PLATELET
Basophils Absolute: 0 10*3/uL (ref 0.0–0.1)
Basophils Relative: 0.4 % (ref 0.0–3.0)
Eosinophils Absolute: 0.2 10*3/uL (ref 0.0–0.7)
Eosinophils Relative: 2.8 % (ref 0.0–5.0)
HCT: 38.8 % (ref 36.0–46.0)
Hemoglobin: 12.7 g/dL (ref 12.0–15.0)
Lymphocytes Relative: 36.2 % (ref 12.0–46.0)
Lymphs Abs: 2.5 10*3/uL (ref 0.7–4.0)
MCHC: 32.6 g/dL (ref 30.0–36.0)
MCV: 79.1 fL (ref 78.0–100.0)
Monocytes Absolute: 0.7 10*3/uL (ref 0.1–1.0)
Monocytes Relative: 9.9 % (ref 3.0–12.0)
Neutro Abs: 3.5 10*3/uL (ref 1.4–7.7)
Neutrophils Relative %: 50.7 % (ref 43.0–77.0)
Platelets: 263 10*3/uL (ref 150.0–400.0)
RBC: 4.9 Mil/uL (ref 3.87–5.11)
RDW: 16.5 % — ABNORMAL HIGH (ref 11.5–15.5)
WBC: 6.9 10*3/uL (ref 4.0–10.5)

## 2023-02-22 LAB — COMPREHENSIVE METABOLIC PANEL
ALT: 22 U/L (ref 0–35)
AST: 17 U/L (ref 0–37)
Albumin: 4.1 g/dL (ref 3.5–5.2)
Alkaline Phosphatase: 60 U/L (ref 39–117)
BUN: 14 mg/dL (ref 6–23)
CO2: 27 meq/L (ref 19–32)
Calcium: 9.4 mg/dL (ref 8.4–10.5)
Chloride: 105 meq/L (ref 96–112)
Creatinine, Ser: 0.79 mg/dL (ref 0.40–1.20)
GFR: 86.37 mL/min (ref >60.00)
Glucose, Bld: 109 mg/dL — ABNORMAL HIGH (ref 70–99)
Potassium: 4.5 meq/L (ref 3.5–5.1)
Sodium: 137 meq/L (ref 135–145)
Total Bilirubin: 0.3 mg/dL (ref 0.2–1.2)
Total Protein: 6.4 g/dL (ref 6.0–8.3)

## 2023-02-22 LAB — HEMOGLOBIN A1C: Hgb A1c MFr Bld: 6.1 % (ref 4.6–6.5)

## 2023-02-22 LAB — LIPID PANEL
Cholesterol: 146 mg/dL (ref 0–200)
HDL: 47.3 mg/dL (ref >39.00)
LDL Cholesterol: 59 mg/dL (ref 0–99)
NonHDL: 98.82
Total CHOL/HDL Ratio: 3
Triglycerides: 200 mg/dL — ABNORMAL HIGH (ref 0.0–149.0)
VLDL: 40 mg/dL (ref 0.0–40.0)

## 2023-02-22 LAB — VITAMIN D 25 HYDROXY (VIT D DEFICIENCY, FRACTURES): VITD: 27.51 ng/mL — ABNORMAL LOW (ref 30.00–100.00)

## 2023-02-22 MED ORDER — POLYETHYLENE GLYCOL 3350 17 GM/SCOOP PO POWD: 17.0000 g | Freq: Two times a day (BID) | ORAL | 1 refills | Status: AC | PRN

## 2023-02-22 MED ORDER — ATORVASTATIN CALCIUM 40 MG PO TABS: 40.0000 mg | ORAL_TABLET | Freq: Every day | ORAL | 3 refills | Status: DC

## 2023-02-22 MED ORDER — SUMATRIPTAN SUCCINATE 50 MG PO TABS: 50.0000 mg | ORAL_TABLET | ORAL | 1 refills | Status: DC | PRN

## 2023-02-22 MED ORDER — BUSPIRONE HCL 15 MG PO TABS: 15.0000 mg | ORAL_TABLET | Freq: Two times a day (BID) | ORAL | 3 refills | Status: DC

## 2023-02-22 MED ORDER — POLYSACCHARIDE IRON COMPLEX 150 MG PO CAPS: 150.0000 mg | ORAL_CAPSULE | Freq: Two times a day (BID) | ORAL | 3 refills | Status: AC

## 2023-02-22 MED ORDER — PANTOPRAZOLE SODIUM 40 MG PO TBEC: 40.0000 mg | DELAYED_RELEASE_TABLET | Freq: Every day | ORAL | 3 refills | Status: DC

## 2023-02-22 MED ORDER — SERTRALINE HCL 100 MG PO TABS: 200.0000 mg | ORAL_TABLET | Freq: Every day | ORAL | 3 refills | Status: DC

## 2023-02-22 NOTE — Progress Notes (Signed)
Established Patient Office Visit  Subjective   Patient ID: Jean Vazquez, female    DOB: 1970/10/28  Age: 52 y.o. MRN: [id number]  Chief Complaint  Patient presents with   Medication Refill    Discuss miralaxx rx.     HPI  52 year old white female, patient of Jean Vazquez presents today for a 54-month recheck.  She has a history of hyperlipidemia, elevated glucose, vitamin D deficiency and anxiety.  Patient reports that she is doing well.  Tolerating medications okay.  Needs a refill on MiraLAX that she does not take routinely but it does help when she needs it for constipation.  No concerns today.  Review of Systems  All other systems reviewed and are negative.  Past Medical History:  Diagnosis Date   Acid reflux    Anemia 2015   IDA   Anxiety    Depression    Hyperlipidemia    Urinary incontinence     Social History   Socioeconomic History   Marital status: Married    Spouse name: Not on file   Number of children: Not on file   Years of education: Not on file   Highest education level: 12th grade  Occupational History   Not on file  Tobacco Use   Smoking status: Former    Current packs/day: 0.00    Average packs/day: 1.5 packs/day for 25.0 years (37.5 ttl pk-yrs)    Types: Cigarettes    Start date: 11    Quit date: 2012    Years since quitting: 12.9    Passive exposure: Past   Smokeless tobacco: Never  Vaping Use   Vaping status: Not on file  Substance and Sexual Activity   Alcohol use: No   Drug use: Never   Sexual activity: Not Currently    Comment: 1st intercourse 52yo-Fewer than 5 partners  Other Topics Concern   Not on file  Social History Narrative   Not on file   Social Drivers of Health   Financial Resource Strain: Medium Risk (08/15/2022)   Overall Financial Resource Strain (CARDIA)    Difficulty of Paying Living Expenses: Somewhat hard  Food Insecurity: Food Insecurity Present (08/15/2022)   Hunger Vital Sign    Worried About Running  Out of Food in the Last Year: Sometimes true    Ran Out of Food in the Last Year: Sometimes true  Transportation Needs: No Transportation Needs (08/15/2022)   PRAPARE - Administrator, Civil Service (Medical): No    Lack of Transportation (Non-Medical): No  Physical Activity: Insufficiently Active (08/15/2022)   Exercise Vital Sign    Days of Exercise per Week: 1 day    Minutes of Exercise per Session: 20 min  Stress: Stress Concern Present (08/15/2022)   [employer] of Occupational Health - Occupational Stress Questionnaire    Feeling of Stress : Rather much  Social Connections: Socially Integrated (08/15/2022)   Social Connection and Isolation Panel [NHANES]    Frequency of Communication with Friends and Family: Twice a week    Frequency of Social Gatherings with Friends and Family: Once a week    Attends Religious Services: More than 4 times per year    Active Member of [employer] or Organizations: Yes    Attends Engineer, structural: More than 4 times per year    Marital Status: Married  Catering manager Violence: Not on file    History reviewed. No pertinent surgical history.  Family History  Problem Relation Age of Onset  Heart disease Mother    Diabetes Mother    Cancer Father        Lung   Anxiety disorder Father    Diabetes Father    Breast cancer Maternal Aunt    Diabetes type I Daughter    Colon cancer Neg Hx    Colon polyps Neg Hx    Esophageal cancer Neg Hx    Stomach cancer Neg Hx    Rectal cancer Neg Hx     No Known Allergies  Current Outpatient Medications on File Prior to Visit  Medication Sig Dispense Refill   Omega-3 Fatty Acids (FISH OIL) 1000 MG CAPS Take by mouth.     No current facility-administered medications on file prior to visit.    BP 126/82   Pulse 84   Temp 97.6 F (36.4 C)   Wt 197 lb 6.4 oz (89.5 kg)   SpO2 99%   BMI 34.97 kg/m chart    Objective:     BP 126/82   Pulse 84   Temp 97.6 F (36.4 C)    Wt 197 lb 6.4 oz (89.5 kg)   SpO2 99%   BMI 34.97 kg/m    Physical Exam Vitals and nursing note reviewed.  Constitutional:      Appearance: Normal appearance. She is obese.  HENT:     Right Ear: Tympanic membrane, ear canal and external ear normal.     Left Ear: Tympanic membrane, ear canal and external ear normal.  Cardiovascular:     Rate and Rhythm: Normal rate and regular rhythm.  Pulmonary:     Effort: Pulmonary effort is normal.     Breath sounds: Normal breath sounds.  Abdominal:     General: Abdomen is flat. Bowel sounds are normal.     Palpations: Abdomen is soft.     Tenderness: There is no abdominal tenderness. There is no rebound.     Hernia: No hernia is present.  Musculoskeletal:        General: Normal range of motion.     Cervical back: Normal range of motion and neck supple.  Skin:    General: Skin is warm and dry.  Neurological:     General: No focal deficit present.     Mental Status: She is alert and oriented to person, place, and time. Mental status is at baseline.  Psychiatric:        Mood and Affect: Mood normal.        Behavior: Behavior normal.        Thought Content: Thought content normal.      No results found for any visits on 02/22/23.    The 10-year ASCVD risk score (Arnett DK, et al., 2019) is: 1.1%    Assessment & Plan:   Problem List Items Addressed This Visit     Pure hypercholesterolemia   Relevant Medications   atorvastatin (LIPITOR) 40 MG tablet   Other Relevant Orders   CMP   CBC w/Diff   HgB A1c   Lipid panel   Nonintractable episodic headache   Relevant Medications   sertraline (ZOLOFT) 100 MG tablet   SUMAtriptan (IMITREX) 50 MG tablet   Gastroesophageal reflux disease   Relevant Medications   pantoprazole (PROTONIX) 40 MG tablet   polyethylene glycol powder (GLYCOLAX/MIRALAX) 17 GM/SCOOP powder   Elevated glucose   Relevant Orders   CMP   CBC w/Diff   HgB A1c   Lipid panel   Other Visit Diagnoses  Immunization due    -  Primary   Relevant Orders   Flu vaccine trivalent PF, 6mos and older(Flulaval,Afluria,Fluarix,Fluzone)     Iron deficiency       Relevant Medications   iron polysaccharides (NIFEREX) 150 MG capsule   Other Relevant Orders   CBC w/Diff     Vitamin D deficiency       Relevant Orders   CBC w/Diff   Vitamin D (25 hydroxy)     Moderate episode of recurrent major depressive disorder (HCC)       Relevant Medications   sertraline (ZOLOFT) 100 MG tablet   busPIRone (BUSPAR) 15 MG tablet     Elevated cholesterol       Relevant Medications   atorvastatin (LIPITOR) 40 MG tablet       No follow-ups on file.  Call the office with any questions or concerns.  Encouraged healthy diet and exercise.  Influenza vaccine administered today.  Recheck in 6 months and sooner as needed.  Eulis Foster, FNP

## 2023-08-05 ENCOUNTER — Encounter: Payer: Self-pay | Admitting: Family Medicine

## 2023-08-26 ENCOUNTER — Ambulatory Visit: Payer: 59 | Admitting: Family Medicine

## 2023-09-16 ENCOUNTER — Other Ambulatory Visit: Payer: Self-pay | Admitting: Family

## 2023-09-16 DIAGNOSIS — R519 Headache, unspecified: Secondary | ICD-10-CM

## 2024-02-17 ENCOUNTER — Other Ambulatory Visit: Payer: Self-pay | Admitting: Family

## 2024-02-17 DIAGNOSIS — K219 Gastro-esophageal reflux disease without esophagitis: Secondary | ICD-10-CM

## 2024-02-17 DIAGNOSIS — E7800 Pure hypercholesterolemia, unspecified: Secondary | ICD-10-CM

## 2024-02-17 DIAGNOSIS — F331 Major depressive disorder, recurrent, moderate: Secondary | ICD-10-CM

## 2024-02-17 NOTE — Telephone Encounter (Signed)
 Contacted patient and informed her of her rx refills being only 30 days due to appointment needed. Patient expressed understanding. Patient is scheduled for 03/23/2024

## 2024-03-19 ENCOUNTER — Other Ambulatory Visit: Payer: Self-pay | Admitting: Family Medicine

## 2024-03-19 DIAGNOSIS — E7800 Pure hypercholesterolemia, unspecified: Secondary | ICD-10-CM

## 2024-03-19 DIAGNOSIS — K219 Gastro-esophageal reflux disease without esophagitis: Secondary | ICD-10-CM

## 2024-03-19 DIAGNOSIS — F331 Major depressive disorder, recurrent, moderate: Secondary | ICD-10-CM

## 2024-03-23 ENCOUNTER — Ambulatory Visit: Payer: Self-pay | Admitting: Family Medicine

## 2024-04-17 ENCOUNTER — Ambulatory Visit: Payer: Self-pay | Admitting: Family Medicine
